# Patient Record
Sex: Female | Born: 1940
Health system: Southern US, Community
[De-identification: ages and names within clinical notes are randomized; demographics above are authoritative.]

## PROBLEM LIST (undated history)

## (undated) DIAGNOSIS — N393 Stress incontinence (female) (male): Secondary | ICD-10-CM

## (undated) DIAGNOSIS — Z77090 Contact with and (suspected) exposure to asbestos: Secondary | ICD-10-CM

## (undated) DIAGNOSIS — E039 Hypothyroidism, unspecified: Secondary | ICD-10-CM

## (undated) DIAGNOSIS — C439 Malignant melanoma of skin, unspecified: Secondary | ICD-10-CM

## (undated) DIAGNOSIS — M1990 Unspecified osteoarthritis, unspecified site: Secondary | ICD-10-CM

## (undated) DIAGNOSIS — M8580 Other specified disorders of bone density and structure, unspecified site: Secondary | ICD-10-CM

## (undated) DIAGNOSIS — M47816 Spondylosis without myelopathy or radiculopathy, lumbar region: Secondary | ICD-10-CM

## (undated) DIAGNOSIS — E785 Hyperlipidemia, unspecified: Secondary | ICD-10-CM

## (undated) DIAGNOSIS — C801 Malignant (primary) neoplasm, unspecified: Secondary | ICD-10-CM

## (undated) DIAGNOSIS — K649 Unspecified hemorrhoids: Secondary | ICD-10-CM

## (undated) DIAGNOSIS — I10 Essential (primary) hypertension: Secondary | ICD-10-CM

## (undated) DIAGNOSIS — E079 Disorder of thyroid, unspecified: Secondary | ICD-10-CM

## (undated) DIAGNOSIS — K209 Esophagitis, unspecified without bleeding: Secondary | ICD-10-CM

## (undated) DIAGNOSIS — Z780 Asymptomatic menopausal state: Secondary | ICD-10-CM

## (undated) DIAGNOSIS — B029 Zoster without complications: Secondary | ICD-10-CM

## (undated) DIAGNOSIS — R638 Other symptoms and signs concerning food and fluid intake: Secondary | ICD-10-CM

## (undated) DIAGNOSIS — M810 Age-related osteoporosis without current pathological fracture: Secondary | ICD-10-CM

## (undated) DIAGNOSIS — I83893 Varicose veins of bilateral lower extremities with other complications: Secondary | ICD-10-CM

## (undated) DIAGNOSIS — K219 Gastro-esophageal reflux disease without esophagitis: Secondary | ICD-10-CM

## (undated) HISTORY — DX: Other specified disorders of bone density and structure, unspecified site: M85.80

## (undated) HISTORY — DX: Stress incontinence (female) (male): N39.3

## (undated) HISTORY — DX: Other symptoms and signs concerning food and fluid intake: R63.8

## (undated) HISTORY — PX: CATARACT EXTRACTION: SUR2

## (undated) HISTORY — DX: Asymptomatic menopausal state: Z78.0

## (undated) HISTORY — DX: Unspecified hemorrhoids: K64.9

## (undated) HISTORY — DX: Gastro-esophageal reflux disease without esophagitis: K21.9

## (undated) HISTORY — DX: Hyperlipidemia, unspecified: E78.5

## (undated) HISTORY — DX: Age-related osteoporosis without current pathological fracture: M81.0

## (undated) HISTORY — PX: TUBAL LIGATION: SHX77

## (undated) HISTORY — PX: EYE SURGERY: SHX253

## (undated) HISTORY — DX: Zoster without complications: B02.9

## (undated) HISTORY — PX: COLONOSCOPY: SHX174

## (undated) HISTORY — DX: Essential (primary) hypertension: I10

## (undated) HISTORY — PX: ESOPHAGOGASTRODUODENOSCOPY: SHX1529

---

## 2004-11-15 ENCOUNTER — Ambulatory Visit: Payer: Self-pay | Admitting: Obstetrics and Gynecology

## 2005-01-18 ENCOUNTER — Ambulatory Visit: Payer: Self-pay | Admitting: Gastroenterology

## 2005-11-16 ENCOUNTER — Ambulatory Visit: Payer: Self-pay | Admitting: Obstetrics and Gynecology

## 2006-07-31 ENCOUNTER — Ambulatory Visit: Payer: Self-pay | Admitting: Gastroenterology

## 2006-11-19 ENCOUNTER — Ambulatory Visit: Payer: Self-pay | Admitting: Obstetrics and Gynecology

## 2007-06-23 ENCOUNTER — Ambulatory Visit: Payer: Self-pay | Admitting: Unknown Physician Specialty

## 2007-12-10 ENCOUNTER — Ambulatory Visit: Payer: Self-pay | Admitting: Obstetrics and Gynecology

## 2009-01-04 ENCOUNTER — Ambulatory Visit: Payer: Self-pay | Admitting: Internal Medicine

## 2010-01-05 ENCOUNTER — Ambulatory Visit: Payer: Self-pay | Admitting: Internal Medicine

## 2010-04-09 LAB — HM PAP SMEAR

## 2011-02-16 ENCOUNTER — Ambulatory Visit: Payer: Self-pay | Admitting: Internal Medicine

## 2012-02-18 ENCOUNTER — Ambulatory Visit: Payer: Self-pay | Admitting: Obstetrics and Gynecology

## 2012-06-07 LAB — HM COLONOSCOPY: HM COLON: NEGATIVE

## 2012-07-07 ENCOUNTER — Ambulatory Visit: Payer: Self-pay | Admitting: Unknown Physician Specialty

## 2012-08-07 LAB — HM DEXA SCAN

## 2012-09-02 ENCOUNTER — Ambulatory Visit: Payer: Self-pay | Admitting: Obstetrics and Gynecology

## 2013-02-18 ENCOUNTER — Ambulatory Visit: Payer: Self-pay | Admitting: Internal Medicine

## 2014-02-19 ENCOUNTER — Ambulatory Visit: Payer: Self-pay | Admitting: Internal Medicine

## 2014-04-09 LAB — HM MAMMOGRAPHY

## 2014-07-06 DIAGNOSIS — E039 Hypothyroidism, unspecified: Secondary | ICD-10-CM | POA: Insufficient documentation

## 2014-07-06 DIAGNOSIS — E782 Mixed hyperlipidemia: Secondary | ICD-10-CM | POA: Insufficient documentation

## 2014-07-06 DIAGNOSIS — K2100 Gastro-esophageal reflux disease with esophagitis, without bleeding: Secondary | ICD-10-CM | POA: Insufficient documentation

## 2015-03-07 ENCOUNTER — Other Ambulatory Visit: Payer: Self-pay | Admitting: Internal Medicine

## 2015-03-07 DIAGNOSIS — Z1231 Encounter for screening mammogram for malignant neoplasm of breast: Secondary | ICD-10-CM

## 2015-03-11 ENCOUNTER — Ambulatory Visit
Admission: RE | Admit: 2015-03-11 | Discharge: 2015-03-11 | Disposition: A | Discharge status: Home / Self Care | Payer: Medicare Other | Source: Ambulatory Visit | Attending: Internal Medicine | Admitting: Internal Medicine

## 2015-03-11 ENCOUNTER — Other Ambulatory Visit: Payer: Self-pay | Admitting: Internal Medicine

## 2015-03-11 DIAGNOSIS — Z1231 Encounter for screening mammogram for malignant neoplasm of breast: Secondary | ICD-10-CM | POA: Diagnosis not present

## 2015-03-11 HISTORY — DX: Malignant (primary) neoplasm, unspecified: C80.1

## 2015-07-12 ENCOUNTER — Ambulatory Visit (INDEPENDENT_AMBULATORY_CARE_PROVIDER_SITE_OTHER): Payer: Medicare Other | Admitting: Obstetrics and Gynecology

## 2015-07-12 ENCOUNTER — Encounter: Payer: Self-pay | Admitting: Obstetrics and Gynecology

## 2015-07-12 VITALS — BP 166/78 | HR 98 | Ht 59.0 in | Wt 145.7 lb

## 2015-07-12 DIAGNOSIS — M810 Age-related osteoporosis without current pathological fracture: Secondary | ICD-10-CM | POA: Insufficient documentation

## 2015-07-12 DIAGNOSIS — N393 Stress incontinence (female) (male): Secondary | ICD-10-CM | POA: Diagnosis not present

## 2015-07-12 DIAGNOSIS — N952 Postmenopausal atrophic vaginitis: Secondary | ICD-10-CM | POA: Diagnosis not present

## 2015-07-12 DIAGNOSIS — Z1211 Encounter for screening for malignant neoplasm of colon: Secondary | ICD-10-CM | POA: Diagnosis not present

## 2015-07-12 DIAGNOSIS — Z1239 Encounter for other screening for malignant neoplasm of breast: Secondary | ICD-10-CM | POA: Diagnosis not present

## 2015-07-12 DIAGNOSIS — Z780 Asymptomatic menopausal state: Secondary | ICD-10-CM | POA: Insufficient documentation

## 2015-07-12 NOTE — Patient Instructions (Signed)
1. Mammogram scheduled 2. Stool guaiac card testing for colon cancer screening ordered 3. Continue with calcium and vitamin D supplementation daily 4. Encourage exercise to maintain bone strength 5. Discuss possible interval DEXA scan with Dr. Sabra Heck 6. Consider adding EVista to osteoporosis management 7. Trial of Premarin vaginal cream twice a week is recommended; samples given 8. Return in 1 year

## 2015-07-12 NOTE — Progress Notes (Signed)
Patient ID: Kathryn Kemp, female   DOB: 1940-06-22, 75 y.o.   MRN: [id number] Annual GYN follow-up exam  Subjective:       Kathryn Kemp is a 75 y.o. G31P1001 female here for a routine annual gynecologic exam.  Current complaints: 1.  Menopause 2. Osteoporosis 3. Stress urinary incontinence  MENOPAUSE: Patient is asymptomatic except for some vaginal burning and irritation; not sexually active OSTEOPOROSIS: Patient is taking calcium with vitamin D; no other medications for osteoporosis; patient is not routinely exercising although she does take care of her sister who has Parkinson's STRESS URINARY INCONTINENCE: Symptoms are mild and as discussed last year she is not interested in physical therapy.   Gynecologic History No LMP recorded. Patient is postmenopausal. Contraception: post menopausal status Last Pap: 2012 wnl. Results were: normal Last mammogram: 01/2015 wnl. Results were: normal  Obstetric History OB History  Gravida Para Term Preterm AB SAB TAB Ectopic Multiple Living  1 1 1       1     # Outcome Date GA Lbr Len/2nd Weight Sex Delivery Anes PTL Lv  1 Term 1973   7 lb 4.8 oz (3.311 kg) M Vag-Spont   Y      Past Medical History  Diagnosis Date  . Cancer (HCC)     skin  . Shingles     h/o breast  . GERD (gastroesophageal reflux disease)   . Hyperlipemia   . Osteoporosis   . Increased BMI   . Hypertension   . SUI (stress urinary incontinence, female)   . Menopause   . Hemorrhoid     external  . Osteopenia     Past Surgical History  Procedure Laterality Date  . Tubal ligation      Current Outpatient Prescriptions on File Prior to Visit  Medication Sig Dispense Refill  . atorvastatin (LIPITOR) 10 MG tablet Take by mouth.    . Calcium-Vitamin D 600-200 MG-UNIT tablet Take by mouth.    . Multiple Vitamins-Minerals (PRESERVISION AREDS 2 PO) Take by mouth.    Marland Kitchen omeprazole (PRILOSEC) 20 MG capsule Take by mouth.     No current facility-administered  medications on file prior to visit.    No Known Allergies  Social History   Social History  . Marital Status: Married    Spouse Name: N/A  . Number of Children: N/A  . Years of Education: N/A   Occupational History  . Not on file.   Social History Main Topics  . Smoking status: Never Smoker   . Smokeless tobacco: Not on file  . Alcohol Use: No  . Drug Use: No  . Sexual Activity: Not Currently    Birth Control/ Protection: Surgical   Other Topics Concern  . Not on file   Social History Narrative    Family History  Problem Relation Age of Onset  . Breast cancer Neg Hx   . Diabetes Neg Hx   . Colon cancer Neg Hx   . Ovarian cancer Neg Hx   . Lung cancer Mother   . Heart disease Mother   . Osteoporosis Mother   . Prostate cancer Father   . Liver cancer Sister     The following portions of the patient's history were reviewed and updated as appropriate: allergies, current medications, past family history, past medical history, past social history, past surgical history and problem list.  Review of Systems ROS Review of Systems - General ROS: negative for - chills, fatigue, fever, hot flashes, night sweats,  weight gain or weight loss Psychological ROS: negative for - anxiety, decreased libido, depression, mood swings, physical abuse or sexual abuse Ophthalmic ROS: negative for - blurry vision, eye pain or loss of vision ENT ROS: negative for - headaches, hearing change, visual changes or vocal changes Allergy and Immunology ROS: negative for - hives, itchy/watery eyes or seasonal allergies Hematological and Lymphatic ROS: negative for - bleeding problems, bruising, swollen lymph nodes or weight loss Endocrine ROS: negative for - galactorrhea, hair pattern changes, hot flashes, malaise/lethargy, mood swings, palpitations, polydipsia/polyuria, skin changes, temperature intolerance or unexpected weight changes Breast ROS: negative for - new or changing breast lumps or  nipple discharge Respiratory ROS: negative for - cough or shortness of breath Cardiovascular ROS: negative for - chest pain, irregular heartbeat, palpitations or shortness of breath Gastrointestinal ROS: no abdominal pain, change in bowel habits, or black or bloody stools Genito-Urinary ROS: no dysuria, trouble voiding, or hematuria Musculoskeletal ROS: negative for - joint pain or joint stiffness Neurological ROS: negative for - bowel and bladder control changes Dermatological ROS: negative for rash and skin lesion changes   Objective:   BP 166/78 mmHg  Pulse 98  Ht 4\' 11"  (1.499 m)  Wt 145 lb 11.2 oz (66.089 kg)  BMI 29.41 kg/m2 CONSTITUTIONAL: Well-developed, well-nourished female in no acute distress.  PSYCHIATRIC: Normal mood and affect. Normal behavior. Normal judgment and thought content. NEUROLGIC: Alert and oriented to person, place, and time. Normal muscle tone coordination. No cranial nerve deficit noted. HENT:  Normocephalic, atraumatic, External right and left ear normal. Oropharynx is clear and moist EYES: Conjunctivae and EOM are normal. Pupils are equal, round, and reactive to light. No scleral icterus.  NECK: Normal range of motion, supple, no masses.  Normal thyroid.  SKIN: Skin is warm and dry. No rash noted. Not diaphoretic. No erythema. No pallor. CARDIOVASCULAR: Normal heart rate noted, regular rhythm, no murmur. RESPIRATORY: Clear to auscultation bilaterally. Effort and breath sounds normal, no problems with respiration noted. BREASTS: Symmetric in size. No masses, skin changes, nipple drainage, or lymphadenopathy. ABDOMEN: Soft, normal bowel sounds, no distention noted.  No tenderness, rebound or guarding.  BLADDER: Normal PELVIC:  External Genitalia: Normal  BUS: Urethral caruncle present  Vagina: Moderate atrophy  Cervix: Normal  Uterus: Normal; midplane, normal size and shape, nontender  Adnexa: Normal; nonpalpable and nontender  RV: External hemorrhoids  present, No Rectal Masses and Normal Sphincter tone  MUSCULOSKELETAL: Normal range of motion. No tenderness.  No cyanosis, clubbing, or edema.  2+ distal pulses. LYMPHATIC: No Axillary, Supraclavicular, or Inguinal Adenopathy.    Assessment:  1. Menopause, minimally symptomatic 2. History of osteoporosis; last DEXA scan 2014 3. Stress urinary incontinence, stable 4. Annual gynecologic examination 75 y.o. 5. Contraception: post menopausal status 6. BMI-29    Plan:  Pap: Not needed Mammogram: Ordered Stool Guaiac Testing:  Ordered Labs: thru pcp Routine preventative health maintenance measures emphasized: Exercise/Diet/Weight control, Tobacco Warnings and Alcohol/Substance use risks Start Premarin cream intravaginal twice a week; this should help vaginal dryness/irritation, and possibly have an impact on unstable bladder symptoms and incontinence Discuss osteoporosis with Dr. Hyacinth Meeker; may consider interval DEXA scan to see if worsening trend is developing; consider a trial of Evista Return to Clinic - 1 Year   Crystal Talco, CMA  Herold Harms, MD  Note: This dictation was prepared with Dragon dictation along with smaller phrase technology. Any transcriptional errors that result from this process are unintentional.

## 2015-08-05 LAB — FECAL OCCULT BLOOD, IMMUNOCHEMICAL: FECAL OCCULT BLD: NEGATIVE

## 2016-02-06 ENCOUNTER — Other Ambulatory Visit: Payer: Self-pay | Admitting: Internal Medicine

## 2016-02-06 DIAGNOSIS — Z1231 Encounter for screening mammogram for malignant neoplasm of breast: Secondary | ICD-10-CM

## 2016-03-15 ENCOUNTER — Ambulatory Visit
Admission: RE | Admit: 2016-03-15 | Discharge: 2016-03-15 | Disposition: A | Discharge status: Home / Self Care | Payer: Medicare Other | Source: Ambulatory Visit | Attending: Internal Medicine | Admitting: Internal Medicine

## 2016-03-15 DIAGNOSIS — Z1231 Encounter for screening mammogram for malignant neoplasm of breast: Secondary | ICD-10-CM | POA: Diagnosis not present

## 2016-07-20 ENCOUNTER — Other Ambulatory Visit: Payer: Self-pay | Admitting: Internal Medicine

## 2016-07-20 DIAGNOSIS — I6523 Occlusion and stenosis of bilateral carotid arteries: Secondary | ICD-10-CM

## 2016-07-24 ENCOUNTER — Ambulatory Visit
Admission: RE | Admit: 2016-07-24 | Discharge: 2016-07-24 | Disposition: A | Discharge status: Home / Self Care | Payer: Medicare Other | Source: Ambulatory Visit | Attending: Internal Medicine | Admitting: Internal Medicine

## 2016-07-24 DIAGNOSIS — I6523 Occlusion and stenosis of bilateral carotid arteries: Secondary | ICD-10-CM

## 2017-02-04 ENCOUNTER — Other Ambulatory Visit: Payer: Self-pay | Admitting: Internal Medicine

## 2017-02-20 ENCOUNTER — Other Ambulatory Visit: Payer: Self-pay | Admitting: Internal Medicine

## 2017-02-20 DIAGNOSIS — Z1231 Encounter for screening mammogram for malignant neoplasm of breast: Secondary | ICD-10-CM

## 2017-02-28 ENCOUNTER — Emergency Department
Admission: EM | Admit: 2017-02-28 | Discharge: 2017-02-28 | Disposition: A | Discharge status: Home / Self Care | Payer: Medicare Other | Attending: Emergency Medicine | Admitting: Emergency Medicine

## 2017-02-28 ENCOUNTER — Encounter: Payer: Self-pay | Admitting: Emergency Medicine

## 2017-02-28 DIAGNOSIS — I10 Essential (primary) hypertension: Secondary | ICD-10-CM | POA: Diagnosis not present

## 2017-02-28 LAB — BASIC METABOLIC PANEL
ANION GAP: 8 (ref 5–15)
BUN: 14 mg/dL (ref 6–20)
CALCIUM: 9.5 mg/dL (ref 8.9–10.3)
CO2: 24 mmol/L (ref 22–32)
Chloride: 105 mmol/L (ref 101–111)
Creatinine, Ser: 0.73 mg/dL (ref 0.44–1.00)
GFR calc non Af Amer: >60 mL/min (ref >60)
Glucose, Bld: 106 mg/dL — ABNORMAL HIGH (ref 65–99)
Potassium: 4.2 mmol/L (ref 3.5–5.1)
Sodium: 137 mmol/L (ref 135–145)

## 2017-02-28 LAB — CBC WITH DIFFERENTIAL/PLATELET
BASOS ABS: 0 10*3/uL (ref 0–0.1)
BASOS PCT: 1 %
Eosinophils Absolute: 0.1 10*3/uL (ref 0–0.7)
Eosinophils Relative: 2 %
HEMATOCRIT: 38.7 % (ref 35.0–47.0)
Hemoglobin: 12.8 g/dL (ref 12.0–16.0)
Lymphocytes Relative: 28 %
Lymphs Abs: 1.7 10*3/uL (ref 1.0–3.6)
MCH: 26 pg (ref 26.0–34.0)
MCHC: 33.1 g/dL (ref 32.0–36.0)
MCV: 78.6 fL — ABNORMAL LOW (ref 80.0–100.0)
MONO ABS: 0.5 10*3/uL (ref 0.2–0.9)
Monocytes Relative: 9 %
NEUTROS ABS: 3.5 10*3/uL (ref 1.4–6.5)
NEUTROS PCT: 60 %
Platelets: 184 10*3/uL (ref 150–440)
RBC: 4.93 MIL/uL (ref 3.80–5.20)
RDW: 13 % (ref 11.5–14.5)
WBC: 5.9 10*3/uL (ref 3.6–11.0)

## 2017-02-28 LAB — TROPONIN I: Troponin I: <0.03 ng/mL (ref <0.03)

## 2017-02-28 NOTE — ED Notes (Signed)
NAD noted at time of D/C. Pt denies questions or concerns. Pt ambulatory to the lobby at this time.  

## 2017-02-28 NOTE — Discharge Instructions (Signed)
Please seek medical attention for any high fevers, chest pain, shortness of breath, change in behavior, persistent vomiting, bloody stool or any other new or concerning symptoms.  

## 2017-02-28 NOTE — ED Provider Notes (Signed)
[facility] Emergency Department Provider Note  ____________________________________________   I have reviewed the triage vital signs and the nursing notes.   HISTORY  Chief Complaint Hypertension   History limited by: Not Limited   HPI Kathryn Kemp is a 76 y.o. female who presents to the emergency department today because of high blood pressure.  DURATION:started this morning TIMING: constant SEVERITY: severe CONTEXT: patient states that she checks her blood pressure every morning. She apparently is not on any medications but checks it every morning so she can report it to her primary care doctor. Normal bp is 130/140s. Today it was significantly higher. She states that she has been under stress recently due to death of her last sister a couple of months ago. She has had one other episode in the past with high blood pressure which she thinks was due to stress. ASSOCIATED SYMPTOMS: slight dizziness and head ache.   Per medical record review patient has a history of hypertension.  Past Medical History:  Diagnosis Date  . Cancer ([place])    skin  . GERD (gastroesophageal reflux disease)   . Hemorrhoid    external  . Hyperlipemia   . Hypertension   . Increased BMI   . Menopause   . Osteopenia   . Osteoporosis   . Shingles    h/o breast  . SUI (stress urinary incontinence, female)     Patient Active Problem List   Diagnosis Date Noted  . Vaginal atrophy 07/12/2015  . Menopause 07/12/2015  . SUI (stress urinary incontinence, female) 07/12/2015  . Osteoporosis 07/12/2015    Past Surgical History:  Procedure Laterality Date  . TUBAL LIGATION      Prior to Admission medications   Medication Sig Start Date End Date Taking? Authorizing Provider  atorvastatin (LIPITOR) 10 MG tablet Take by mouth. 07/13/14 07/13/15  [provider]  Calcium-Vitamin D 600-200 MG-UNIT tablet Take by mouth.    [provider]  Multiple  Vitamins-Minerals (PRESERVISION AREDS 2 PO) Take by mouth.    [provider]  omeprazole (PRILOSEC) 20 MG capsule Take by mouth. 07/13/14   [provider]    Allergies Patient has no known allergies.  Family History  Problem Relation Age of Onset  . Lung cancer Mother   . Heart disease Mother   . Osteoporosis Mother   . Prostate cancer Father   . Liver cancer Sister   . Breast cancer Neg Hx   . Diabetes Neg Hx   . Colon cancer Neg Hx   . Ovarian cancer Neg Hx     Social History Social History   Tobacco Use  . Smoking status: Never Smoker  . Smokeless tobacco: Never Used  Substance Use Topics  . Alcohol use: No  . Drug use: No    Review of Systems Constitutional: No fever/chills Eyes: No visual changes. ENT: No sore throat. Cardiovascular: Denies chest pain. Respiratory: Denies shortness of breath. Gastrointestinal: No abdominal pain.  No nausea, no vomiting.  No diarrhea.   Genitourinary: Negative for dysuria. Musculoskeletal: Negative for back pain. Skin: Negative for rash. Neurological: Positive for headache. ____________________________________________   PHYSICAL EXAM:  VITAL SIGNS: ED Triage Vitals  Enc Vitals Group     BP 02/28/17 0859 (!) 208/97     Pulse Rate 02/28/17 0859 96     Resp 02/28/17 0859 16     Temp 02/28/17 0859 (!) 97.5 F (36.4 C)     Temp Source 02/28/17 0859 Oral  SpO2 02/28/17 0859 100 %     Weight 02/28/17 0900 145 lb (65.8 kg)     Height --      Head Circumference --      Peak Flow --      Pain Score 02/28/17 0859 0   Constitutional: Alert and oriented. Well appearing and in no distress. Eyes: Conjunctivae are normal.  ENT   Head: Normocephalic and atraumatic.   Nose: No congestion/rhinnorhea.   Mouth/Throat: Mucous membranes are moist.   Neck: No stridor. Hematological/Lymphatic/Immunilogical: No cervical lymphadenopathy. Cardiovascular: Normal rate, regular rhythm.  No murmurs, rubs, or  gallops.  Respiratory: Normal respiratory effort without tachypnea nor retractions. Breath sounds are clear and equal bilaterally. No wheezes/rales/rhonchi. Gastrointestinal: Soft and non tender. No rebound. No guarding.  Genitourinary: Deferred Musculoskeletal: Normal range of motion in all extremities. No lower extremity edema. Neurologic:  Normal speech and language. No gross focal neurologic deficits are appreciated.  Skin:  Skin is warm, dry and intact. No rash noted. Psychiatric: Mood and affect are normal. Speech and behavior are normal. Patient exhibits appropriate insight and judgment.  ____________________________________________    LABS (pertinent positives/negatives)  Trop <0.03 CBC wnl except mcv BMP wnl except glu 106  ____________________________________________   EKG  None  ____________________________________________    RADIOLOGY  None  ____________________________________________   PROCEDURES  Procedures  ____________________________________________   INITIAL IMPRESSION / ASSESSMENT AND PLAN / ED COURSE  Pertinent labs & imaging results that were available during my care of the patient were reviewed by me and considered in my medical decision making (see chart for details).  Patient presents because of concern for high blood pressure. Blood work does not show any evidence of cardiac or kidney injury. No elevated WBC count to suggest infection. Discussed importance of follow up with primary care doctor with patient.  ____________________________________________   FINAL CLINICAL IMPRESSION(S) / ED DIAGNOSES  Final diagnoses:  Hypertension, unspecified type     Note: This dictation was prepared with Dragon dictation. Any transcriptional errors that result from this process are unintentional     Nance Pear, MD 02/28/17 1123

## 2017-02-28 NOTE — ED Triage Notes (Signed)
Pt to ed with c/o htn at home today.  Reports she is not on meds for bp at home but felt dizzy today so she checked BP and found it to be high.  Pt denies chest pain, reports mild headache.

## 2017-02-28 NOTE — ED Notes (Signed)
MD aware of patient's BP at D/C, states okay for D/C.

## 2017-03-05 DIAGNOSIS — I10 Essential (primary) hypertension: Secondary | ICD-10-CM | POA: Insufficient documentation

## 2017-03-19 ENCOUNTER — Ambulatory Visit
Admission: RE | Admit: 2017-03-19 | Discharge: 2017-03-19 | Disposition: A | Discharge status: Home / Self Care | Payer: Medicare Other | Source: Ambulatory Visit | Attending: Internal Medicine | Admitting: Internal Medicine

## 2017-03-19 DIAGNOSIS — Z1231 Encounter for screening mammogram for malignant neoplasm of breast: Secondary | ICD-10-CM | POA: Diagnosis not present

## 2017-07-24 DIAGNOSIS — Z0000 Encounter for general adult medical examination without abnormal findings: Secondary | ICD-10-CM | POA: Insufficient documentation

## 2017-09-17 ENCOUNTER — Encounter: Payer: Self-pay | Admitting: Student

## 2017-09-18 ENCOUNTER — Encounter: Payer: Self-pay | Admitting: *Deleted

## 2017-09-18 ENCOUNTER — Ambulatory Visit: Payer: Medicare Other | Admitting: Anesthesiology

## 2017-09-18 ENCOUNTER — Encounter
Admission: RE | Disposition: A | Discharge status: Home / Self Care | Payer: Self-pay | Source: Ambulatory Visit | Attending: Unknown Physician Specialty

## 2017-09-18 ENCOUNTER — Ambulatory Visit
Admission: RE | Admit: 2017-09-18 | Discharge: 2017-09-18 | Disposition: A | Discharge status: Home / Self Care | Payer: Medicare Other | Source: Ambulatory Visit | Attending: Unknown Physician Specialty | Admitting: Unknown Physician Specialty

## 2017-09-18 DIAGNOSIS — K5790 Diverticulosis of intestine, part unspecified, without perforation or abscess without bleeding: Secondary | ICD-10-CM | POA: Insufficient documentation

## 2017-09-18 DIAGNOSIS — Z86010 Personal history of colonic polyps: Secondary | ICD-10-CM | POA: Diagnosis not present

## 2017-09-18 DIAGNOSIS — K640 First degree hemorrhoids: Secondary | ICD-10-CM | POA: Insufficient documentation

## 2017-09-18 DIAGNOSIS — Z1211 Encounter for screening for malignant neoplasm of colon: Secondary | ICD-10-CM | POA: Diagnosis present

## 2017-09-18 HISTORY — DX: Esophagitis, unspecified without bleeding: K20.90

## 2017-09-18 HISTORY — PX: COLONOSCOPY WITH PROPOFOL: SHX5780

## 2017-09-18 HISTORY — DX: Esophagitis, unspecified: K20.9

## 2017-09-18 HISTORY — DX: Zoster without complications: B02.9

## 2017-09-18 HISTORY — DX: Disorder of thyroid, unspecified: E07.9

## 2017-09-18 HISTORY — DX: Unspecified osteoarthritis, unspecified site: M19.90

## 2017-09-18 SURGERY — COLONOSCOPY WITH PROPOFOL
Anesthesia: General

## 2017-09-18 MED ORDER — LIDOCAINE HCL (PF) 2 % IJ SOLN
INTRAMUSCULAR | Status: AC
  Filled 2017-09-18: qty 10

## 2017-09-18 MED ORDER — FENTANYL CITRATE (PF) 100 MCG/2ML IJ SOLN
INTRAMUSCULAR | Status: DC | PRN
  Administered 2017-09-18 (×2): 50 ug via INTRAVENOUS

## 2017-09-18 MED ORDER — PROPOFOL 500 MG/50ML IV EMUL
INTRAVENOUS | Status: AC
  Filled 2017-09-18: qty 50

## 2017-09-18 MED ORDER — LIDOCAINE 2% (20 MG/ML) 5 ML SYRINGE
INTRAMUSCULAR | Status: DC | PRN
  Administered 2017-09-18: 30 mg via INTRAVENOUS

## 2017-09-18 MED ORDER — PROPOFOL 500 MG/50ML IV EMUL
INTRAVENOUS | Status: DC | PRN
  Administered 2017-09-18: 150 ug/kg/min via INTRAVENOUS

## 2017-09-18 MED ORDER — FENTANYL CITRATE (PF) 100 MCG/2ML IJ SOLN
INTRAMUSCULAR | Status: AC
  Filled 2017-09-18: qty 2

## 2017-09-18 MED ORDER — SODIUM CHLORIDE 0.9 % IV SOLN: INTRAVENOUS | Status: DC

## 2017-09-18 MED ORDER — SODIUM CHLORIDE 0.9 % IV SOLN
INTRAVENOUS | Status: DC
  Administered 2017-09-18: 10:00:00 via INTRAVENOUS
  Administered 2017-09-18: 1000 mL via INTRAVENOUS

## 2017-09-18 MED ORDER — PROPOFOL 10 MG/ML IV BOLUS
INTRAVENOUS | Status: DC | PRN
  Administered 2017-09-18: 100 mg via INTRAVENOUS

## 2017-09-18 NOTE — Anesthesia Post-op Follow-up Note (Signed)
Anesthesia QCDR form completed.        

## 2017-09-18 NOTE — Transfer of Care (Signed)
Immediate Anesthesia Transfer of Care Note  Patient: Kathryn Kemp  Procedure(s) Performed: COLONOSCOPY WITH PROPOFOL (N/A )  Patient Location: PACU and Endoscopy Unit  Anesthesia Type:General  Level of Consciousness: awake, drowsy and patient cooperative  Airway & Oxygen Therapy: Patient Spontanous Breathing and Patient connected to nasal cannula oxygen  Post-op Assessment: Report given to RN and Post -op Vital signs reviewed and stable  Post vital signs: Reviewed and stable  Last Vitals:  Vitals Value Taken Time  BP 115/68 09/18/2017 10:10 AM  Temp 36.1 C 09/18/2017 10:10 AM  Pulse 68 09/18/2017 10:10 AM  Resp 11 09/18/2017 10:10 AM  SpO2 99 % 09/18/2017 10:10 AM  Vitals shown include unvalidated device data.  Last Pain:  Vitals:   09/18/17 1000  TempSrc: Tympanic  PainSc:          Complications: No apparent anesthesia complications

## 2017-09-18 NOTE — Op Note (Signed)
[facility] Gastroenterology Patient Name: Kathryn Kemp Procedure Date: 09/18/2017 9:38 AM MRN: [id number] Account #: [id number] Date of Birth: [birth year] Admit Type: Outpatient Age: 77 Room: [facility] ENDO ROOM 3 Gender: Female Note Status: Finalized Procedure:            Colonoscopy Indications:          High risk colon cancer surveillance: Personal history                        of colonic polyps Providers:            Scot Jun, MD Referring MD:         Danella Penton, MD (Referring MD) Medicines:            Propofol per Anesthesia Complications:        No immediate complications. Procedure:            Pre-Anesthesia Assessment:                       - After reviewing the risks and benefits, the patient                        was deemed in satisfactory condition to undergo the                        procedure.                       After obtaining informed consent, the colonoscope was                        passed under direct vision. Throughout the procedure,                        the patient's blood pressure, pulse, and oxygen                        saturations were monitored continuously. The                        Colonoscope was introduced through the anus and                        advanced to the the cecum, identified by appendiceal                        orifice and ileocecal valve. The colonoscopy was                        performed without difficulty. The patient tolerated the                        procedure well. The quality of the bowel preparation                        was good. Findings:      Internal hemorrhoids were found during endoscopy. The hemorrhoids were       small and Grade I (internal hemorrhoids that do not prolapse).      A few small-mouthed diverticula were found in the ascending colon.      The exam was  otherwise without abnormality. Impression:           - Internal hemorrhoids.                       - Diverticulosis in the  proximal ascending colon. (few                        small)                       - The examination was otherwise normal.                       - No specimens collected. Recommendation:       - Repeat colonoscopy in 5 years for adenoma                        surveillance. Scot Jun, MD 09/18/2017 10:05:30 AM This report has been signed electronically. Number of Addenda: 0 Note Initiated On: 09/18/2017 9:38 AM Scope Withdrawal Time: 0 hours 10 minutes 11 seconds  Total Procedure Duration: 0 hours 13 minutes 44 seconds       [facility]

## 2017-09-18 NOTE — H&P (Signed)
Primary Care Physician:  Rusty Aus, MD Primary Gastroenterologist:  Dr. Vira Agar  Pre-Procedure History & Physical: HPI:  Kathryn Kemp is a 77 y.o. female is here for an colonoscopy.  Done for Kathryn Kemp colon polyps.   Past Medical History:  Diagnosis Date  . Arthritis   . Cancer ([place])    skin  . Esophagitis   . GERD (gastroesophageal reflux disease)   . Hemorrhoid    external  . Herpes zoster   . Hyperlipemia   . Hypertension   . Increased BMI   . Menopause   . Osteopenia   . Osteopenia   . Osteoporosis   . Shingles    h/o breast  . SUI (stress urinary incontinence, female)   . Thyroid disease     Past Surgical History:  Procedure Laterality Date  . CATARACT EXTRACTION    . COLONOSCOPY    . TUBAL LIGATION      Prior to Admission medications   Medication Sig Start Date End Date Taking? Authorizing Provider  hydrochlorothiazide (HYDRODIURIL) 25 MG tablet Take 25 mg by mouth daily.   Yes [provider]  Calcium-Vitamin D 600-200 MG-UNIT tablet Take by mouth.    [provider]  Multiple Vitamins-Minerals (PRESERVISION AREDS 2 PO) Take by mouth.    [provider]  omeprazole (PRILOSEC) 20 MG capsule Take by mouth. 07/13/14   [provider]    Allergies as of 08/05/2017  . (No Known Allergies)    Family History  Problem Relation Age of Onset  . Lung cancer Mother   . Heart disease Mother   . Osteoporosis Mother   . Prostate cancer Father   . Liver cancer Sister   . Breast cancer Neg Hx   . Diabetes Neg Hx   . Colon cancer Neg Hx   . Ovarian cancer Neg Hx     Social History   Socioeconomic History  . Marital status: Married    Spouse name: Not on file  . Number of children: Not on file  . Years of education: Not on file  . Highest education level: Not on file  Occupational History  . Not on file  Social Needs  . Financial resource strain: Not on file  . Food insecurity:    Worry: Not on file    Inability: Not  on file  . Transportation needs:    Medical: Not on file    Non-medical: Not on file  Tobacco Use  . Smoking status: Never Smoker  . Smokeless tobacco: Never Used  Substance and Sexual Activity  . Alcohol use: No  . Drug use: No  . Sexual activity: Not Currently    Birth control/protection: Surgical  Lifestyle  . Physical activity:    Days per week: Not on file    Minutes per session: Not on file  . Stress: Not on file  Relationships  . Social connections:    Talks on phone: Not on file    Gets together: Not on file    Attends religious service: Not on file    Active member of club or organization: Not on file    Attends meetings of clubs or organizations: Not on file    Relationship status: Not on file  . Intimate partner violence:    Fear of current or ex partner: Not on file    Emotionally abused: Not on file    Physically abused: Not on file    Forced sexual activity: Not on  file  Other Topics Concern  . Not on file  Social History Narrative  . Not on file    Review of Systems: See HPI, otherwise negative ROS  Physical Exam: BP (!) 156/77   Pulse 87   Temp (!) 97 F (36.1 C) (Tympanic)   Resp 18   Ht 4\' 11"  (1.499 m)   Wt 62.1 kg (137 lb)   SpO2 96%   BMI 27.67 kg/m  General:   Alert,  pleasant and cooperative in NAD Head:  Normocephalic and atraumatic. Neck:  Supple; no masses or thyromegaly. Lungs:  Clear throughout to auscultation.    Heart:  Regular rate and rhythm. Abdomen:  Soft, nontender and nondistended. Normal bowel sounds, without guarding, and without rebound.   Neurologic:  Alert and  oriented x4;  grossly normal neurologically.  Impression/Plan: Kathryn Kemp is here for an colonoscopy to be performed for Kathryn Kemp colon polyps.  Risks, benefits, limitations, and alternatives regarding  colonoscopy have been reviewed with the patient.  Questions have been answered.  All parties agreeable.   [name], MD  09/18/2017, 9:34 AM

## 2017-09-18 NOTE — Anesthesia Preprocedure Evaluation (Signed)
Anesthesia Evaluation  Patient identified by MRN, date of birth, ID band Patient awake    Reviewed: Allergy & Precautions, H&P , NPO status , Patient's Chart, lab work & pertinent test results, reviewed documented beta blocker date and time   History of Anesthesia Complications Negative for: history of anesthetic complications  Airway Mallampati: II  TM Distance: >3 FB Neck ROM: full    Dental  (+) Dental Advidsory Given, Partial Upper, Caps, Teeth Intact   Pulmonary neg pulmonary ROS,           Cardiovascular Exercise Tolerance: Good hypertension, (-) angina(-) CAD, (-) Past MI, (-) Cardiac Stents and (-) CABG (-) dysrhythmias (-) Valvular Problems/Murmurs     Neuro/Psych negative neurological ROS  negative psych ROS   GI/Hepatic Neg liver ROS, GERD  ,  Endo/Other  negative endocrine ROS  Renal/GU negative Renal ROS  negative genitourinary   Musculoskeletal   Abdominal   Peds  Hematology negative hematology ROS (+)   Anesthesia Other Findings Past Medical History: No date: Arthritis No date: Cancer ([place])     Comment:  skin No date: Esophagitis No date: GERD (gastroesophageal reflux disease) No date: Hemorrhoid     Comment:  external No date: Herpes zoster No date: Hyperlipemia No date: Hypertension No date: Increased BMI No date: Menopause No date: Osteopenia No date: Osteopenia No date: Osteoporosis No date: Shingles     Comment:  h/o breast No date: SUI (stress urinary incontinence, female) No date: Thyroid disease   Reproductive/Obstetrics negative OB ROS                             Anesthesia Physical Anesthesia Plan  ASA: II  Anesthesia Plan: General   Post-op Pain Management:    Induction: Intravenous  PONV Risk Score and Plan: 3 and Propofol infusion  Airway Management Planned: Nasal Cannula  Additional Equipment:   Intra-op Plan:   Post-operative  Plan:   Informed Consent: I have reviewed the patients History and Physical, chart, labs and discussed the procedure including the risks, benefits and alternatives for the proposed anesthesia with the patient or authorized representative who has indicated his/her understanding and acceptance.   Dental Advisory Given  Plan Discussed with: Anesthesiologist, CRNA and Surgeon  Anesthesia Plan Comments:         Anesthesia Quick Evaluation

## 2017-09-19 NOTE — Anesthesia Postprocedure Evaluation (Addendum)
Anesthesia Post Note  Patient: Kathryn Kemp  Procedure(s) Performed: COLONOSCOPY WITH PROPOFOL (N/A )  Patient location during evaluation: Endoscopy Anesthesia Type: General Level of consciousness: awake and alert Pain management: pain level controlled Vital Signs Assessment: post-procedure vital signs reviewed and stable Respiratory status: spontaneous breathing, nonlabored ventilation, respiratory function stable and patient connected to nasal cannula oxygen Cardiovascular status: blood pressure returned to baseline and stable Postop Assessment: no apparent nausea or vomiting Anesthetic complications: no     Last Vitals:  Vitals:   09/18/17 1020 09/18/17 1030  BP: 135/73 137/73  Pulse: 66 69  Resp: 13 (!) 21  Temp:    SpO2: 99% 95%    Last Pain:  Vitals:   09/18/17 1000  TempSrc: Tympanic  PainSc:                  Martha Clan

## 2017-09-24 ENCOUNTER — Encounter: Payer: Self-pay | Admitting: Unknown Physician Specialty

## 2018-02-24 ENCOUNTER — Other Ambulatory Visit: Payer: Self-pay | Admitting: Internal Medicine

## 2018-02-24 DIAGNOSIS — Z1231 Encounter for screening mammogram for malignant neoplasm of breast: Secondary | ICD-10-CM

## 2018-02-25 DIAGNOSIS — H02831 Dermatochalasis of right upper eyelid: Secondary | ICD-10-CM | POA: Insufficient documentation

## 2018-02-25 DIAGNOSIS — H5340 Unspecified visual field defects: Secondary | ICD-10-CM | POA: Insufficient documentation

## 2018-02-25 DIAGNOSIS — H02403 Unspecified ptosis of bilateral eyelids: Secondary | ICD-10-CM | POA: Insufficient documentation

## 2018-03-17 ENCOUNTER — Ambulatory Visit: Payer: Medicare Other | Admitting: Occupational Therapy

## 2018-03-21 ENCOUNTER — Ambulatory Visit
Admission: RE | Admit: 2018-03-21 | Discharge: 2018-03-21 | Disposition: A | Discharge status: Home / Self Care | Payer: Medicare Other | Source: Ambulatory Visit | Attending: Internal Medicine | Admitting: Internal Medicine

## 2018-03-21 DIAGNOSIS — Z1231 Encounter for screening mammogram for malignant neoplasm of breast: Secondary | ICD-10-CM | POA: Diagnosis present

## 2018-03-26 ENCOUNTER — Other Ambulatory Visit: Payer: Self-pay | Admitting: Internal Medicine

## 2018-03-26 DIAGNOSIS — R928 Other abnormal and inconclusive findings on diagnostic imaging of breast: Secondary | ICD-10-CM

## 2018-03-26 DIAGNOSIS — N6489 Other specified disorders of breast: Secondary | ICD-10-CM

## 2018-04-07 ENCOUNTER — Ambulatory Visit: Payer: Medicare Other | Admitting: Occupational Therapy

## 2018-04-08 ENCOUNTER — Ambulatory Visit
Admission: RE | Admit: 2018-04-08 | Discharge: 2018-04-08 | Disposition: A | Discharge status: Home / Self Care | Payer: Medicare Other | Source: Ambulatory Visit | Attending: Internal Medicine | Admitting: Internal Medicine

## 2018-04-08 ENCOUNTER — Other Ambulatory Visit: Payer: Self-pay | Admitting: Internal Medicine

## 2018-04-08 DIAGNOSIS — N6489 Other specified disorders of breast: Secondary | ICD-10-CM

## 2018-04-08 DIAGNOSIS — R928 Other abnormal and inconclusive findings on diagnostic imaging of breast: Secondary | ICD-10-CM

## 2018-04-10 ENCOUNTER — Other Ambulatory Visit: Payer: Self-pay | Admitting: Internal Medicine

## 2018-04-10 DIAGNOSIS — N6320 Unspecified lump in the left breast, unspecified quadrant: Secondary | ICD-10-CM

## 2018-04-15 DIAGNOSIS — R1084 Generalized abdominal pain: Secondary | ICD-10-CM | POA: Diagnosis not present

## 2018-04-15 DIAGNOSIS — M65311 Trigger thumb, right thumb: Secondary | ICD-10-CM | POA: Diagnosis not present

## 2018-04-15 DIAGNOSIS — Z0000 Encounter for general adult medical examination without abnormal findings: Secondary | ICD-10-CM | POA: Diagnosis not present

## 2018-04-15 DIAGNOSIS — K922 Gastrointestinal hemorrhage, unspecified: Secondary | ICD-10-CM | POA: Diagnosis not present

## 2018-05-19 DIAGNOSIS — Z872 Personal history of diseases of the skin and subcutaneous tissue: Secondary | ICD-10-CM | POA: Diagnosis not present

## 2018-05-19 DIAGNOSIS — Z85820 Personal history of malignant melanoma of skin: Secondary | ICD-10-CM | POA: Diagnosis not present

## 2018-05-19 DIAGNOSIS — L821 Other seborrheic keratosis: Secondary | ICD-10-CM | POA: Diagnosis not present

## 2018-05-19 DIAGNOSIS — L578 Other skin changes due to chronic exposure to nonionizing radiation: Secondary | ICD-10-CM | POA: Diagnosis not present

## 2018-05-19 DIAGNOSIS — L570 Actinic keratosis: Secondary | ICD-10-CM | POA: Diagnosis not present

## 2018-05-19 DIAGNOSIS — Z1283 Encounter for screening for malignant neoplasm of skin: Secondary | ICD-10-CM | POA: Diagnosis not present

## 2018-05-19 DIAGNOSIS — Z85828 Personal history of other malignant neoplasm of skin: Secondary | ICD-10-CM | POA: Diagnosis not present

## 2018-07-16 ENCOUNTER — Encounter: Payer: Self-pay | Admitting: *Deleted

## 2018-07-29 DIAGNOSIS — E039 Hypothyroidism, unspecified: Secondary | ICD-10-CM | POA: Diagnosis not present

## 2018-07-29 DIAGNOSIS — Z1331 Encounter for screening for depression: Secondary | ICD-10-CM | POA: Diagnosis not present

## 2018-07-29 DIAGNOSIS — Z77090 Contact with and (suspected) exposure to asbestos: Secondary | ICD-10-CM | POA: Diagnosis not present

## 2018-07-29 DIAGNOSIS — E782 Mixed hyperlipidemia: Secondary | ICD-10-CM | POA: Diagnosis not present

## 2018-07-29 DIAGNOSIS — Z79899 Other long term (current) drug therapy: Secondary | ICD-10-CM | POA: Diagnosis not present

## 2018-07-29 DIAGNOSIS — Z0000 Encounter for general adult medical examination without abnormal findings: Secondary | ICD-10-CM | POA: Diagnosis not present

## 2018-09-03 ENCOUNTER — Other Ambulatory Visit: Payer: Self-pay | Admitting: *Deleted

## 2018-09-03 NOTE — Patient Outreach (Signed)
[facility]) Care Management  09/03/2018  Kathryn Kemp December 11, 1940 [id number]   Telephone assessment complete according to health risk assessment through [facility].  No needs identified, [facility] information letter, pamphlet, and magnet sent to member.  Will follow up within the next 6 months.  Valente David, [place], MSN [place] [phone]

## 2018-10-06 ENCOUNTER — Other Ambulatory Visit: Payer: Self-pay | Admitting: *Deleted

## 2018-10-06 NOTE — Patient Outreach (Signed)
[facility]) Care Management  10/06/2018  GEANA WALTS 1940-04-10 [id number]   Member will be followed by external care management program.  Will close case at this time, will notify primary MD of transition to external program.  Valente David, RN, MSN [place] Manager [phone]

## 2018-10-07 ENCOUNTER — Ambulatory Visit
Admission: RE | Admit: 2018-10-07 | Discharge: 2018-10-07 | Disposition: A | Discharge status: Home / Self Care | Payer: PPO | Source: Ambulatory Visit | Attending: Internal Medicine | Admitting: Internal Medicine

## 2018-10-07 ENCOUNTER — Other Ambulatory Visit: Payer: Self-pay

## 2018-10-07 DIAGNOSIS — N6321 Unspecified lump in the left breast, upper outer quadrant: Secondary | ICD-10-CM | POA: Diagnosis not present

## 2018-10-07 DIAGNOSIS — N6320 Unspecified lump in the left breast, unspecified quadrant: Secondary | ICD-10-CM | POA: Insufficient documentation

## 2019-01-13 ENCOUNTER — Ambulatory Visit: Payer: No Typology Code available for payment source | Admitting: *Deleted

## 2019-04-09 ENCOUNTER — Ambulatory Visit: Payer: PPO | Attending: Internal Medicine

## 2019-04-09 DIAGNOSIS — Z20822 Contact with and (suspected) exposure to covid-19: Secondary | ICD-10-CM

## 2019-04-15 ENCOUNTER — Telehealth: Payer: Self-pay

## 2019-04-15 LAB — NOVEL CORONAVIRUS, NAA

## 2019-04-15 NOTE — Telephone Encounter (Signed)
Pt called to get covid test results from 12/31 @ [place]. Pt will have to be retested. Call was transferred to scheduling nurse.   [place]

## 2019-04-16 ENCOUNTER — Ambulatory Visit: Payer: PPO | Attending: Internal Medicine

## 2019-04-16 DIAGNOSIS — Z20822 Contact with and (suspected) exposure to covid-19: Secondary | ICD-10-CM

## 2019-04-18 ENCOUNTER — Telehealth: Payer: Self-pay | Admitting: Hematology

## 2019-04-18 LAB — NOVEL CORONAVIRUS, NAA: SARS-CoV-2, NAA: NOT DETECTED

## 2019-04-18 NOTE — Telephone Encounter (Signed)
Pt is aware  covid 19 test is neg on 04-18-2019

## 2019-04-24 ENCOUNTER — Other Ambulatory Visit: Payer: Self-pay | Admitting: Internal Medicine

## 2019-04-24 DIAGNOSIS — N6320 Unspecified lump in the left breast, unspecified quadrant: Secondary | ICD-10-CM

## 2019-05-01 ENCOUNTER — Ambulatory Visit
Admission: RE | Admit: 2019-05-01 | Discharge: 2019-05-01 | Disposition: A | Discharge status: Home / Self Care | Payer: PPO | Source: Ambulatory Visit | Attending: Internal Medicine | Admitting: Internal Medicine

## 2019-05-01 DIAGNOSIS — N6342 Unspecified lump in left breast, subareolar: Secondary | ICD-10-CM | POA: Insufficient documentation

## 2019-05-01 DIAGNOSIS — N6320 Unspecified lump in the left breast, unspecified quadrant: Secondary | ICD-10-CM

## 2019-05-01 DIAGNOSIS — N6321 Unspecified lump in the left breast, upper outer quadrant: Secondary | ICD-10-CM | POA: Diagnosis not present

## 2019-05-01 DIAGNOSIS — R928 Other abnormal and inconclusive findings on diagnostic imaging of breast: Secondary | ICD-10-CM | POA: Diagnosis not present

## 2019-05-12 DIAGNOSIS — H353131 Nonexudative age-related macular degeneration, bilateral, early dry stage: Secondary | ICD-10-CM | POA: Diagnosis not present

## 2019-07-24 DIAGNOSIS — E782 Mixed hyperlipidemia: Secondary | ICD-10-CM | POA: Diagnosis not present

## 2019-07-24 DIAGNOSIS — E039 Hypothyroidism, unspecified: Secondary | ICD-10-CM | POA: Diagnosis not present

## 2019-07-31 DIAGNOSIS — I83893 Varicose veins of bilateral lower extremities with other complications: Secondary | ICD-10-CM | POA: Insufficient documentation

## 2019-07-31 DIAGNOSIS — I10 Essential (primary) hypertension: Secondary | ICD-10-CM | POA: Diagnosis not present

## 2019-07-31 DIAGNOSIS — Z0000 Encounter for general adult medical examination without abnormal findings: Secondary | ICD-10-CM | POA: Diagnosis not present

## 2019-07-31 DIAGNOSIS — E782 Mixed hyperlipidemia: Secondary | ICD-10-CM | POA: Diagnosis not present

## 2019-08-04 ENCOUNTER — Other Ambulatory Visit (INDEPENDENT_AMBULATORY_CARE_PROVIDER_SITE_OTHER): Payer: Self-pay | Admitting: Nurse Practitioner

## 2019-08-04 DIAGNOSIS — I83819 Varicose veins of unspecified lower extremities with pain: Secondary | ICD-10-CM

## 2019-08-12 ENCOUNTER — Ambulatory Visit (INDEPENDENT_AMBULATORY_CARE_PROVIDER_SITE_OTHER): Payer: PPO | Admitting: Nurse Practitioner

## 2019-08-12 ENCOUNTER — Encounter (INDEPENDENT_AMBULATORY_CARE_PROVIDER_SITE_OTHER): Payer: Self-pay | Admitting: Nurse Practitioner

## 2019-08-12 ENCOUNTER — Ambulatory Visit (INDEPENDENT_AMBULATORY_CARE_PROVIDER_SITE_OTHER): Payer: PPO

## 2019-08-12 ENCOUNTER — Encounter (INDEPENDENT_AMBULATORY_CARE_PROVIDER_SITE_OTHER): Payer: Self-pay

## 2019-08-12 ENCOUNTER — Other Ambulatory Visit: Payer: Self-pay

## 2019-08-12 VITALS — BP 165/81 | HR 93 | Resp 16 | Ht 59.0 in | Wt 142.0 lb

## 2019-08-12 DIAGNOSIS — I83819 Varicose veins of unspecified lower extremities with pain: Secondary | ICD-10-CM | POA: Diagnosis not present

## 2019-08-12 DIAGNOSIS — I10 Essential (primary) hypertension: Secondary | ICD-10-CM | POA: Diagnosis not present

## 2019-08-12 DIAGNOSIS — I83893 Varicose veins of bilateral lower extremities with other complications: Secondary | ICD-10-CM | POA: Diagnosis not present

## 2019-08-12 NOTE — Progress Notes (Signed)
Subjective:    Patient ID: Kathryn Kemp, female    DOB: [birth year], 79 y.o.   MRN: [id number] Chief Complaint  Patient presents with  . New Patient (Initial Visit)    varicose veins    The patient is seen for evaluation of symptomatic varicose veins. The patient relates burning and stinging which worsened steadily throughout the course of the day, particularly with standing. The patient also notes an aching and throbbing pain over the varicosities, particularly with prolonged dependent positions. The symptoms are significantly improved with elevation.  The patient also notes that during hot weather the symptoms are greatly intensified. The patient states the pain from the varicose veins interferes with work, daily exercise, shopping and household maintenance. At this point, the symptoms are persistent and severe enough that they're having a negative impact on lifestyle and are interfering with daily activities.  There is no history of DVT, PE or superficial thrombophlebitis. There is no history of ulceration or hemorrhage. The patient endorses a significant family history of varicose veins. OB history: G1P1  The patient has not worn graduated compression in the past. At the present time the patient has not been using over-the-counter analgesics. There is no history of prior surgical intervention or sclerotherapy.  Today the patient underwent noninvasive studies which showed evidence of venous reflux in the right lower extremity in the great saphenous vein at the proximal thigh extending to the distal thigh.  The vein diameters range from 0.29cm to 0.48 cm.  The left lower extremity has reflux in the great saphenous vein at the saphenofemoral junction extending to the proximal calf.  These vein diameters range from 0.51 cm to 0.74 cm.  The patient has no evidence of DVT or superficial venous thrombosis bilaterally   Review of Systems  Cardiovascular: Positive for leg swelling.   Scattered varicosities bilaterally  All other systems reviewed and are negative.      Objective:   Physical Exam Vitals reviewed.  Constitutional:      Appearance: Normal appearance.  Cardiovascular:     Rate and Rhythm: Normal rate and regular rhythm.     Pulses: Normal pulses.     Heart sounds: Normal heart sounds.  Pulmonary:     Effort: Pulmonary effort is normal.     Breath sounds: Normal breath sounds.  Neurological:     Mental Status: She is alert and oriented to person, place, and time.  Psychiatric:        Mood and Affect: Mood normal.        Behavior: Behavior normal.        Thought Content: Thought content normal.        Judgment: Judgment normal.     BP (!) 165/81 (BP Location: Right Arm)   Pulse 93   Resp 16   Ht 4\' 11"  (1.499 m)   Wt 142 lb (64.4 kg)   BMI 28.68 kg/m   Past Medical History:  Diagnosis Date  . Arthritis   . Cancer (HCC)    skin  . Esophagitis   . GERD (gastroesophageal reflux disease)   . Hemorrhoid    external  . Herpes zoster   . Hyperlipemia   . Hypertension   . Increased BMI   . Menopause   . Osteopenia   . Osteopenia   . Osteoporosis   . Shingles    h/o breast  . SUI (stress urinary incontinence, female)   . Thyroid disease     Social History  Socioeconomic History  . Marital status: Married    Spouse name: Not on file  . Number of children: Not on file  . Years of education: Not on file  . Highest education level: Not on file  Occupational History  . Not on file  Tobacco Use  . Smoking status: Never Smoker  . Smokeless tobacco: Never Used  Substance and Sexual Activity  . Alcohol use: No  . Drug use: No  . Sexual activity: Not Currently    Birth control/protection: Surgical  Other Topics Concern  . Not on file  Social History Narrative  . Not on file   Social Determinants of Health   Financial Resource Strain:   . Difficulty of Paying Living Expenses:   Food Insecurity:   . Worried About Community education officer in the Last Year:   . Barista in the Last Year:   Transportation Needs:   . Freight forwarder (Medical):   Marland Kitchen Lack of Transportation (Non-Medical):   Physical Activity:   . Days of Exercise per Week:   . Minutes of Exercise per Session:   Stress:   . Feeling of Stress :   Social Connections:   . Frequency of Communication with Friends and Family:   . Frequency of Social Gatherings with Friends and Family:   . Attends Religious Services:   . Active Member of Clubs or Organizations:   . Attends Banker Meetings:   Marland Kitchen Marital Status:   Intimate Partner Violence:   . Fear of Current or Ex-Partner:   . Emotionally Abused:   Marland Kitchen Physically Abused:   . Sexually Abused:     Past Surgical History:  Procedure Laterality Date  . CATARACT EXTRACTION    . COLONOSCOPY    . COLONOSCOPY WITH PROPOFOL N/A 09/18/2017   Procedure: COLONOSCOPY WITH PROPOFOL;  Surgeon: Scot Jun, MD;  [facility] ENDOSCOPY;  Service: Endoscopy;  Laterality: N/A;  . TUBAL LIGATION      Family History  Problem Relation Age of Onset  . Lung cancer Mother   . Heart disease Mother   . Osteoporosis Mother   . Prostate cancer Father   . Liver cancer Sister   . Breast cancer Neg Hx   . Diabetes Neg Hx   . Colon cancer Neg Hx   . Ovarian cancer Neg Hx     No Known Allergies     Assessment & Plan:   1. Symptomatic varicose veins of both lower extremities  Recommend:  The patient has large symptomatic varicose veins that are painful and associated with swelling.  I have had a long discussion with the patient regarding  varicose veins and why they cause symptoms.  Patient will begin wearing graduated compression stockings class 1 on a daily basis, beginning first thing in the morning and removing them in the evening. The patient is instructed specifically not to sleep in the stockings.    The patient  will also begin using over-the-counter analgesics such as Motrin  600 mg po TID to help control the symptoms.    In addition, behavioral modification including elevation during the day will be initiated.    Pending the results of these changes the  patient will be reevaluated in three months.   An  ultrasound of the venous system will be obtained.   Further plans will be based on the ultrasound results and whether conservative therapies are successful at eliminating the pain and swelling.   2.  Benign essential hypertension Continue antihypertensive medications as already ordered, these medications have been reviewed and there are no changes at this time.    Current Outpatient Medications on File Prior to Visit  Medication Sig Dispense Refill  . Calcium-Vitamin D 600-200 MG-UNIT tablet Take by mouth.    . hydrochlorothiazide (HYDRODIURIL) 25 MG tablet Take 25 mg by mouth daily.    . Multiple Vitamins-Minerals (PRESERVISION AREDS 2 PO) Take by mouth.    . pravastatin (PRAVACHOL) 40 MG tablet Take 40 mg by mouth daily.    Marland Kitchen omeprazole (PRILOSEC) 20 MG capsule Take by mouth.     No current facility-administered medications on file prior to visit.    There are no Patient Instructions on file for this visit. No follow-ups on file.   Georgiana Spinner, NP

## 2019-09-24 DIAGNOSIS — L821 Other seborrheic keratosis: Secondary | ICD-10-CM | POA: Diagnosis not present

## 2019-09-24 DIAGNOSIS — Z85828 Personal history of other malignant neoplasm of skin: Secondary | ICD-10-CM | POA: Diagnosis not present

## 2019-09-24 DIAGNOSIS — Z872 Personal history of diseases of the skin and subcutaneous tissue: Secondary | ICD-10-CM | POA: Diagnosis not present

## 2019-09-24 DIAGNOSIS — Z85820 Personal history of malignant melanoma of skin: Secondary | ICD-10-CM | POA: Diagnosis not present

## 2019-09-24 DIAGNOSIS — L578 Other skin changes due to chronic exposure to nonionizing radiation: Secondary | ICD-10-CM | POA: Diagnosis not present

## 2019-09-24 DIAGNOSIS — L720 Epidermal cyst: Secondary | ICD-10-CM | POA: Diagnosis not present

## 2019-10-08 DIAGNOSIS — R1031 Right lower quadrant pain: Secondary | ICD-10-CM | POA: Diagnosis not present

## 2019-11-12 ENCOUNTER — Encounter (INDEPENDENT_AMBULATORY_CARE_PROVIDER_SITE_OTHER): Payer: Self-pay | Admitting: Nurse Practitioner

## 2019-11-12 ENCOUNTER — Ambulatory Visit (INDEPENDENT_AMBULATORY_CARE_PROVIDER_SITE_OTHER): Payer: PPO | Admitting: Nurse Practitioner

## 2019-11-12 ENCOUNTER — Other Ambulatory Visit: Payer: Self-pay

## 2019-11-12 VITALS — BP 167/73 | HR 98 | Resp 16 | Wt 145.6 lb

## 2019-11-12 DIAGNOSIS — I10 Essential (primary) hypertension: Secondary | ICD-10-CM | POA: Diagnosis not present

## 2019-11-12 DIAGNOSIS — I83819 Varicose veins of unspecified lower extremities with pain: Secondary | ICD-10-CM | POA: Diagnosis not present

## 2019-11-12 DIAGNOSIS — E782 Mixed hyperlipidemia: Secondary | ICD-10-CM

## 2019-11-12 NOTE — Patient Instructions (Signed)
Endovenous Ablation Endovenous ablation is a procedure that seals off an abnormally enlarged leg vein (varicose vein). This procedure uses heat from radiofrequency waves or a laser to close off the affected vein. This procedure may be done if the vein is causing pain, swelling, sores on the skin (ulcers), or skin discoloration. Closing off the vein can help reduce these symptoms by preventing the pooling of blood that causes varicose veins. Tell a health care provider about:  Any allergies you have.  All medicines you are taking, including vitamins, herbs, eye drops, creams, and over-the-counter medicines.  Any problems you or family members have had with anesthetic medicines.  Any blood disorders you have.  Any surgeries you have had.  Any medical conditions you have or have had.  Whether you are pregnant or may be pregnant. What are the risks? Generally, this is a safe procedure. However, problems may occur, including:  Infection.  Bleeding.  Allergic reactions to medicines.  Damage to other structures.  Numbness or tingling along the leg. This is uncommon, and it is usually temporary.  Vein swelling. This is usually temporary.  Blood clots that form in a deep vein of the leg (deep vein thrombosis, or DVT) and can travel to the lungs (pulmonary embolism, or PE). This is very rare. What happens before the procedure? Medicines Ask your health care provider about:  Changing or stopping your regular medicines. This is especially important if you are taking diabetes medicines or blood thinners.  Taking medicines such as aspirin and ibuprofen. These medicines can thin your blood. Do not take these medicines unless your health care provider tells you to take them.  Taking over-the-counter medicines, vitamins, herbs, and supplements. Eating and drinking Follow instructions from your health care provider about eating and drinking, which may include:  8 hours before the procedure  - stop eating heavy meals or foods, such as meat, fried foods, or fatty foods.  6 hours before the procedure - stop eating light meals or foods, such as toast or cereal.  6 hours before the procedure - stop drinking milk or drinks that contain milk.  2 hours before the procedure - stop drinking clear liquids. Staying hydrated Follow instructions from your health care provider about hydration, which may include:  Up to 2 hours before the procedure - you may continue to drink clear liquids, such as water, clear fruit juice, black coffee, and plain tea.  General instructions  You may have blood tests to make sure that your blood can clot normally.  Plan to have someone take you home from the hospital or clinic.  If you will be going home right after the procedure, plan to have someone with you for 24 hours.  Do not use any products that contain nicotine or tobacco for at least 4 weeks before the procedure. These products include cigarettes, e-cigarettes, and chewing tobacco. If you need help quitting, ask your health care provider.  Ask your health care provider what steps will be taken to help prevent infection. These may include: ? Removing hair at the surgery site. ? Washing skin with a germ-killing soap. What happens during the procedure?   You will lie on an exam table.  An IV will be inserted into one of your veins.  You will be given: ? A medicine to help you relax (sedative). ? A medicine to numb the area (local anesthetic).  Your health care provider will use an imaging tool that uses sound waves (ultrasonogram) to show images  of your leg veins.  A small incision will be made near the area that will be treated. A narrow tube (catheter) will be slipped through the incision and into the vein.  Small sensors (electrodes or laser fibers) will be passed through the catheter and into the vein.  Radiofrequency or laser energy will be sent through the sensors to burn the vein.  This seals off the vein.  The electrodes, laser fibers, and catheter will be removed from the vein.  A bandage (dressing) will be placed over the incision. The procedure may vary among health care providers and hospitals. What happens after the procedure?  Your blood pressure, heart rate, breathing rate, and blood oxygen level may be monitored until you leave the hospital or clinic.  You may have to wear compression stockings. These stockings help to prevent blood clots and reduce swelling in your legs.  You will be encouraged to walk around right after the procedure.  Do not drive for 24 hours if you were given a sedative during your procedure. Summary  Endovenous ablation is a procedure that seals off an abnormally enlarged leg vein (varicose vein).  Follow instructions from your health care provider about changing or stopping your medicines. This is especially important if you are taking diabetes medicines or blood thinners.  Follow instructions from your health care provider about eating and drinking before the procedure.  Plan to have someone take you home from the hospital or clinic.  After the procedure, you may have to wear compression stockings, and you will be encouraged to walk around right away. This information is not intended to replace advice given to you by your health care provider. Make sure you discuss any questions you have with your health care provider. Document Revised: 12/04/2017 Document Reviewed: 12/04/2017 Elsevier Patient Education  [place] After This sheet gives you information about how to care for yourself after your procedure. Your health care provider may also give you more specific instructions. If you have problems or questions, contact your health care provider. What can I expect after the procedure? After the procedure, it is common to have:  Bruising.  Tenderness. Follow these instructions at  home: Incision care   Follow instructions from your health care provider about how to take care of your incision. Make sure you: ? Wash your hands with soap and water before and after you change your bandage (dressing). If soap and water are not available, use hand sanitizer. ? Change your dressing as told by your health care provider. ? Follow instructions from your health care provider about when you should remove your dressing.  Check your incision area every day for signs of infection. Check for: ? Redness, swelling, or pain. ? Fluid or blood. ? Warmth. ? Pus or a bad smell.  Keep the dressing dry until your health care provider says it can be removed. Activity  Avoid sitting for a long time without moving. Get up to take short walks every 1-2 hours. This is important to improve blood flow. Ask for help if you feel weak or unsteady.  Rest as told by your health care provider. General instructions  Take over-the-counter and prescription medicines only as told by your health care provider.  Do not drive for 24 hours if you were given a sedative during your procedure.  Do not take long car trips or travel by air until your health care provider has approved.  Wear compression stockings as told by  your health care provider. These stockings help to prevent blood clots and reduce swelling in your legs.  Do not use any products that contain nicotine or tobacco, such as cigarettes, e-cigarettes, and chewing tobacco. These can delay incision healing after the procedure. If you need help quitting, ask your health care provider.  Keep all follow-up visits as told by your health care provider. This is important. Contact a health care provider if you have:  A fever.  Redness, swelling, or pain at the site of your incision.  Fluid or blood coming from your incision.  Warmth at the incision area.  Pus or a bad smell coming from your incision. Get help right away if you:  Notice red  streaks coming from the incision.  Develop nausea or vomiting.  Have trouble breathing.  Develop chest pain. Summary  Follow instructions from your health care provider about how to take care of your incision.  Avoid sitting for a long time without moving. Get up to take short walks every 1-2 hours.  Wear compression stockings as told by your health care provider. These stockings help to prevent blood clots and reduce swelling in your legs.  Contact a health care provider if you have redness, swelling, or pain at the site of your incision.  Keep all follow-up visits as told by your health care provider. This is important. This information is not intended to replace advice given to you by your health care provider. Make sure you discuss any questions you have with your health care provider. Document Revised: 12/04/2017 Document Reviewed: 12/04/2017 Elsevier Patient Education  2020 [employer].

## 2019-11-16 ENCOUNTER — Encounter (INDEPENDENT_AMBULATORY_CARE_PROVIDER_SITE_OTHER): Payer: Self-pay | Admitting: Nurse Practitioner

## 2019-11-16 NOTE — Progress Notes (Signed)
Subjective:    Patient ID: Kathryn Kemp, female    DOB: 01/21/1941, 79 y.o.   MRN: [id number] Chief Complaint  Patient presents with  . Follow-up    63month follow up    The patient returns for followup evaluation 3 months after the initial visit. The patient continues to have pain in the lower extremities with dependency. The pain is lessened with elevation. Graduated compression stockings, Class I (20-30 mmHg), have been worn but the stockings do not eliminate the leg pain. Over-the-counter analgesics do not improve the symptoms. The degree of discomfort continues to interfere with daily activities. The patient notes the pain in the legs is causing problems with daily exercise, at the workplace and even with household activities and maintenance such as standing in the kitchen preparing meals and doing dishes.   Venous ultrasound shows normal deep venous system, no evidence of acute or chronic DVT.  Previous noninvasive studies on 08/12/2019, which showed evidence of venous reflux in the right lower extremity in the great saphenous vein at the proximal thigh extending to the distal thigh.  The vein diameters range from 0.29cm to 0.48 cm.  The left lower extremity has reflux in the great saphenous vein at the saphenofemoral junction extending to the proximal calf.  These vein diameters range from 0.51 cm to 0.74 cm.  The patient has no evidence of DVT or superficial venous thrombosis bilaterally   Review of Systems  Cardiovascular: Positive for leg swelling.       Scattered varicosities bilaterally with large varicosity on the left lower extremity  All other systems reviewed and are negative.      Objective:   Physical Exam Vitals reviewed.  HENT:     Head: Normocephalic.  Cardiovascular:     Rate and Rhythm: Normal rate and regular rhythm.     Pulses: Normal pulses.     Heart sounds: Normal heart sounds.  Pulmonary:     Effort: Pulmonary effort is normal.     Breath sounds: Normal  breath sounds.  Musculoskeletal:        General: Normal range of motion.  Skin:    General: Skin is warm and dry.  Neurological:     Mental Status: She is alert and oriented to person, place, and time.  Psychiatric:        Mood and Affect: Mood normal.        Behavior: Behavior normal.        Thought Content: Thought content normal.        Judgment: Judgment normal.     BP (!) 167/73 (BP Location: Left Arm)   Pulse 98   Resp 16   Wt 145 lb 9.6 oz (66 kg)   BMI 29.41 kg/m   Past Medical History:  Diagnosis Date  . Arthritis   . Cancer (HCC)    skin  . Esophagitis   . GERD (gastroesophageal reflux disease)   . Hemorrhoid    external  . Herpes zoster   . Hyperlipemia   . Hypertension   . Increased BMI   . Menopause   . Osteopenia   . Osteopenia   . Osteoporosis   . Shingles    h/o breast  . SUI (stress urinary incontinence, female)   . Thyroid disease     Social History   Socioeconomic History  . Marital status: Married    Spouse name: Not on file  . Number of children: Not on file  . Years of education:  Not on file  . Highest education level: Not on file  Occupational History  . Not on file  Tobacco Use  . Smoking status: Never Smoker  . Smokeless tobacco: Never Used  Substance and Sexual Activity  . Alcohol use: No  . Drug use: No  . Sexual activity: Not Currently    Birth control/protection: Surgical  Other Topics Concern  . Not on file  Social History Narrative  . Not on file   Social Determinants of Health   Financial Resource Strain:   . Difficulty of Paying Living Expenses:   Food Insecurity:   . Worried About Programme researcher, broadcasting/film/video in the Last Year:   . Barista in the Last Year:   Transportation Needs:   . Freight forwarder (Medical):   Marland Kitchen Lack of Transportation (Non-Medical):   Physical Activity:   . Days of Exercise per Week:   . Minutes of Exercise per Session:   Stress:   . Feeling of Stress :   Social Connections:    . Frequency of Communication with Friends and Family:   . Frequency of Social Gatherings with Friends and Family:   . Attends Religious Services:   . Active Member of Clubs or Organizations:   . Attends Banker Meetings:   Marland Kitchen Marital Status:   Intimate Partner Violence:   . Fear of Current or Ex-Partner:   . Emotionally Abused:   Marland Kitchen Physically Abused:   . Sexually Abused:     Past Surgical History:  Procedure Laterality Date  . CATARACT EXTRACTION    . COLONOSCOPY    . COLONOSCOPY WITH PROPOFOL N/A 09/18/2017   Procedure: COLONOSCOPY WITH PROPOFOL;  Surgeon: Scot Jun, MD;  Location: [facility] ENDOSCOPY;  Service: Endoscopy;  Laterality: N/A;  . TUBAL LIGATION      Family History  Problem Relation Age of Onset  . Lung cancer Mother   . Heart disease Mother   . Osteoporosis Mother   . Prostate cancer Father   . Liver cancer Sister   . Breast cancer Neg Hx   . Diabetes Neg Hx   . Colon cancer Neg Hx   . Ovarian cancer Neg Hx     No Known Allergies     Assessment & Plan:   1. Varicose veins with pain   Recommend  I have reviewed my previous  discussion with the patient regarding  varicose veins and why they cause symptoms. Patient will continue  wearing graduated compression stockings class 1 on a daily basis, beginning first thing in the morning and removing them in the evening.    In addition, behavioral modification including elevation during the day was again discussed and this will continue.  The patient has utilized over the counter pain medications and has been exercising.  However, at this time conservative therapy has not alleviated the patient's symptoms of leg pain and swelling  Recommend: laser ablation of the right and  left great saphenous veins to eliminate the symptoms of pain and swelling of the lower extremities caused by the severe superficial venous reflux disease.   The patient would like more time to consider undergoing  endovenous laser ablation.  The patient will contact her office if she wishes to proceed.  2. Benign essential hypertension Continue antihypertensive medications as already ordered, these medications have been reviewed and there are no changes at this time.   3. Combined hyperlipidemia Continue statin as ordered and reviewed, no changes at this time  Current Outpatient Medications on File Prior to Visit  Medication Sig Dispense Refill  . Calcium-Vitamin D 600-200 MG-UNIT tablet Take by mouth.    . hydrochlorothiazide (HYDRODIURIL) 25 MG tablet Take 25 mg by mouth daily.    . Multiple Vitamins-Minerals (PRESERVISION AREDS 2 PO) Take by mouth.    Marland Kitchen omeprazole (PRILOSEC) 20 MG capsule Take by mouth.    . pravastatin (PRAVACHOL) 40 MG tablet Take 40 mg by mouth daily.     No current facility-administered medications on file prior to visit.    Patient Instructions  Endovenous Ablation Endovenous ablation is a procedure that seals off an abnormally enlarged leg vein (varicose vein). This procedure uses heat from radiofrequency waves or a laser to close off the affected vein. This procedure may be done if the vein is causing pain, swelling, sores on the skin (ulcers), or skin discoloration. Closing off the vein can help reduce these symptoms by preventing the pooling of blood that causes varicose veins. Tell a health care provider about:  Any allergies you have.  All medicines you are taking, including vitamins, herbs, eye drops, creams, and over-the-counter medicines.  Any problems you or family members have had with anesthetic medicines.  Any blood disorders you have.  Any surgeries you have had.  Any medical conditions you have or have had.  Whether you are pregnant or may be pregnant. What are the risks? Generally, this is a safe procedure. However, problems may occur, including:  Infection.  Bleeding.  Allergic reactions to medicines.  Damage to other  structures.  Numbness or tingling along the leg. This is uncommon, and it is usually temporary.  Vein swelling. This is usually temporary.  Blood clots that form in a deep vein of the leg (deep vein thrombosis, or DVT) and can travel to the lungs (pulmonary embolism, or PE). This is very rare. What happens before the procedure? Medicines Ask your health care provider about:  Changing or stopping your regular medicines. This is especially important if you are taking diabetes medicines or blood thinners.  Taking medicines such as aspirin and ibuprofen. These medicines can thin your blood. Do not take these medicines unless your health care provider tells you to take them.  Taking over-the-counter medicines, vitamins, herbs, and supplements. Eating and drinking Follow instructions from your health care provider about eating and drinking, which may include:  8 hours before the procedure - stop eating heavy meals or foods, such as meat, fried foods, or fatty foods.  6 hours before the procedure - stop eating light meals or foods, such as toast or cereal.  6 hours before the procedure - stop drinking milk or drinks that contain milk.  2 hours before the procedure - stop drinking clear liquids. Staying hydrated Follow instructions from your health care provider about hydration, which may include:  Up to 2 hours before the procedure - you may continue to drink clear liquids, such as water, clear fruit juice, black coffee, and plain tea.  General instructions  You may have blood tests to make sure that your blood can clot normally.  Plan to have someone take you home from the hospital or clinic.  If you will be going home right after the procedure, plan to have someone with you for 24 hours.  Do not use any products that contain nicotine or tobacco for at least 4 weeks before the procedure. These products include cigarettes, e-cigarettes, and chewing tobacco. If you need help quitting,  ask your health care  provider.  Ask your health care provider what steps will be taken to help prevent infection. These may include: ? Removing hair at the surgery site. ? Washing skin with a germ-killing soap. What happens during the procedure?   You will lie on an exam table.  An IV will be inserted into one of your veins.  You will be given: ? A medicine to help you relax (sedative). ? A medicine to numb the area (local anesthetic).  Your health care provider will use an imaging tool that uses sound waves (ultrasonogram) to show images of your leg veins.  A small incision will be made near the area that will be treated. A narrow tube (catheter) will be slipped through the incision and into the vein.  Small sensors (electrodes or laser fibers) will be passed through the catheter and into the vein.  Radiofrequency or laser energy will be sent through the sensors to burn the vein. This seals off the vein.  The electrodes, laser fibers, and catheter will be removed from the vein.  A bandage (dressing) will be placed over the incision. The procedure may vary among health care providers and hospitals. What happens after the procedure?  Your blood pressure, heart rate, breathing rate, and blood oxygen level may be monitored until you leave the hospital or clinic.  You may have to wear compression stockings. These stockings help to prevent blood clots and reduce swelling in your legs.  You will be encouraged to walk around right after the procedure.  Do not drive for 24 hours if you were given a sedative during your procedure. Summary  Endovenous ablation is a procedure that seals off an abnormally enlarged leg vein (varicose vein).  Follow instructions from your health care provider about changing or stopping your medicines. This is especially important if you are taking diabetes medicines or blood thinners.  Follow instructions from your health care provider about eating and  drinking before the procedure.  Plan to have someone take you home from the hospital or clinic.  After the procedure, you may have to wear compression stockings, and you will be encouraged to walk around right away. This information is not intended to replace advice given to you by your health care provider. Make sure you discuss any questions you have with your health care provider. Document Revised: 12/04/2017 Document Reviewed: 12/04/2017 Elsevier Patient Education  2020 [employer]  Endovenous Ablation, Care After This sheet gives you information about how to care for yourself after your procedure. Your health care provider may also give you more specific instructions. If you have problems or questions, contact your health care provider. What can I expect after the procedure? After the procedure, it is common to have:  Bruising.  Tenderness. Follow these instructions at home: Incision care   Follow instructions from your health care provider about how to take care of your incision. Make sure you: ? Wash your hands with soap and water before and after you change your bandage (dressing). If soap and water are not available, use hand sanitizer. ? Change your dressing as told by your health care provider. ? Follow instructions from your health care provider about when you should remove your dressing.  Check your incision area every day for signs of infection. Check for: ? Redness, swelling, or pain. ? Fluid or blood. ? Warmth. ? Pus or a bad smell.  Keep the dressing dry until your health care provider says it can be removed. Activity  Avoid sitting  for a long time without moving. Get up to take short walks every 1-2 hours. This is important to improve blood flow. Ask for help if you feel weak or unsteady.  Rest as told by your health care provider. General instructions  Take over-the-counter and prescription medicines only as told by your health care provider.  Do not  drive for 24 hours if you were given a sedative during your procedure.  Do not take long car trips or travel by air until your health care provider has approved.  Wear compression stockings as told by your health care provider. These stockings help to prevent blood clots and reduce swelling in your legs.  Do not use any products that contain nicotine or tobacco, such as cigarettes, e-cigarettes, and chewing tobacco. These can delay incision healing after the procedure. If you need help quitting, ask your health care provider.  Keep all follow-up visits as told by your health care provider. This is important. Contact a health care provider if you have:  A fever.  Redness, swelling, or pain at the site of your incision.  Fluid or blood coming from your incision.  Warmth at the incision area.  Pus or a bad smell coming from your incision. Get help right away if you:  Notice red streaks coming from the incision.  Develop nausea or vomiting.  Have trouble breathing.  Develop chest pain. Summary  Follow instructions from your health care provider about how to take care of your incision.  Avoid sitting for a long time without moving. Get up to take short walks every 1-2 hours.  Wear compression stockings as told by your health care provider. These stockings help to prevent blood clots and reduce swelling in your legs.  Contact a health care provider if you have redness, swelling, or pain at the site of your incision.  Keep all follow-up visits as told by your health care provider. This is important. This information is not intended to replace advice given to you by your health care provider. Make sure you discuss any questions you have with your health care provider. Document Revised: 12/04/2017 Document Reviewed: 12/04/2017 Elsevier Patient Education  2020 [employer].   No follow-ups on file.   Georgiana Spinner, NP

## 2020-01-05 DIAGNOSIS — M65331 Trigger finger, right middle finger: Secondary | ICD-10-CM | POA: Diagnosis not present

## 2020-01-05 DIAGNOSIS — M8949 Other hypertrophic osteoarthropathy, multiple sites: Secondary | ICD-10-CM | POA: Diagnosis not present

## 2020-01-21 IMAGING — MG DIGITAL DIAGNOSTIC UNILATERAL LEFT MAMMOGRAM WITH TOMO AND CAD
4 series · 4 of 12 positions shown · non-contrast
Comparison: Previous exam(s).

CLINICAL DATA: Possible asymmetry in the outer left breast
anteriorly in the craniocaudal projection of a recent screening
mammogram.

EXAM:
DIGITAL DIAGNOSTIC LEFT MAMMOGRAM WITH CAD AND TOMO
ULTRASOUND LEFT BREAST

[L ML synth-2D]
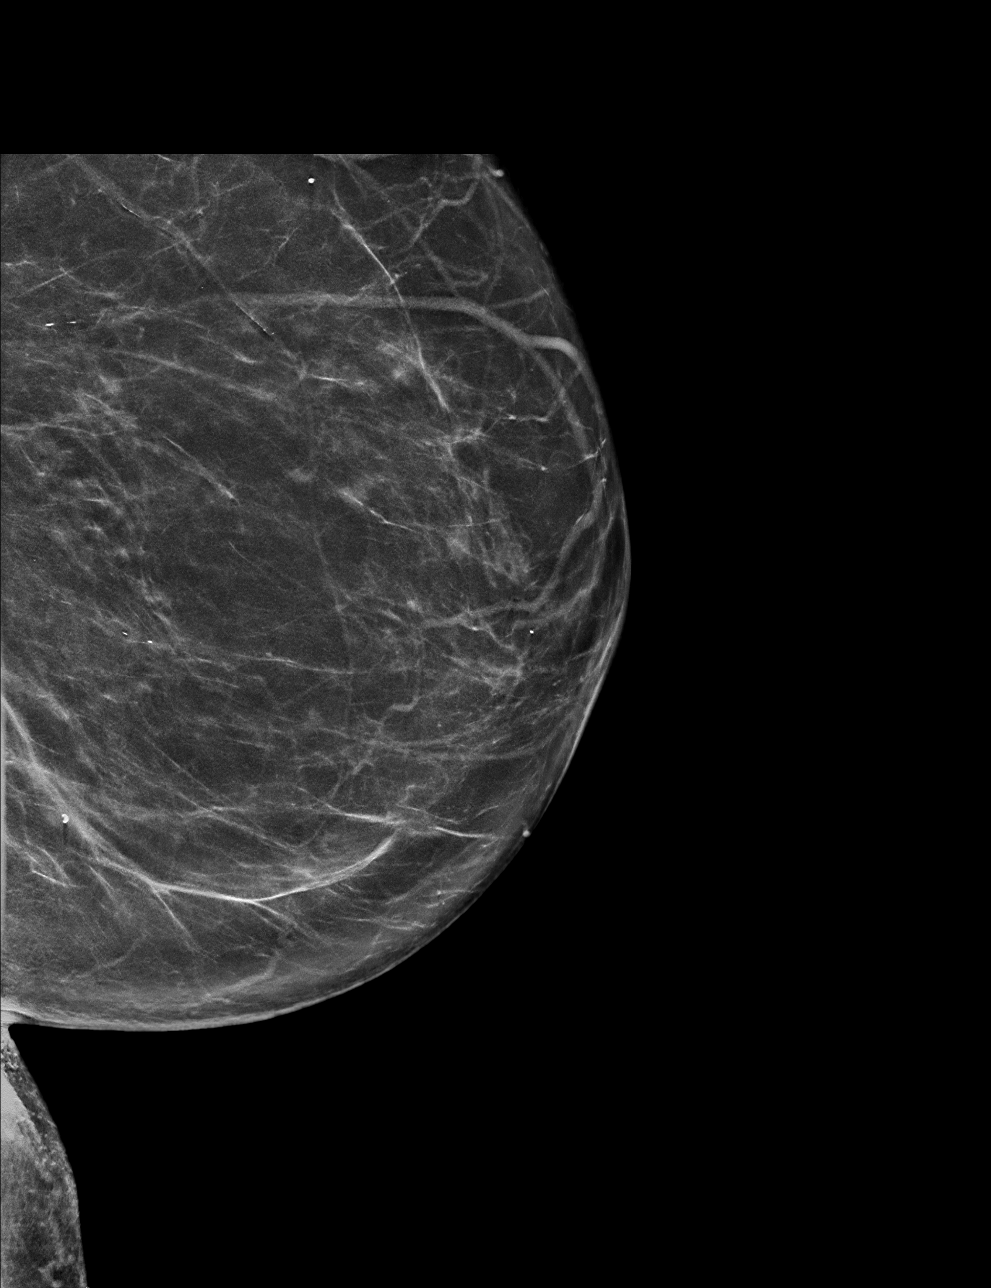

[L CC synth-2D]
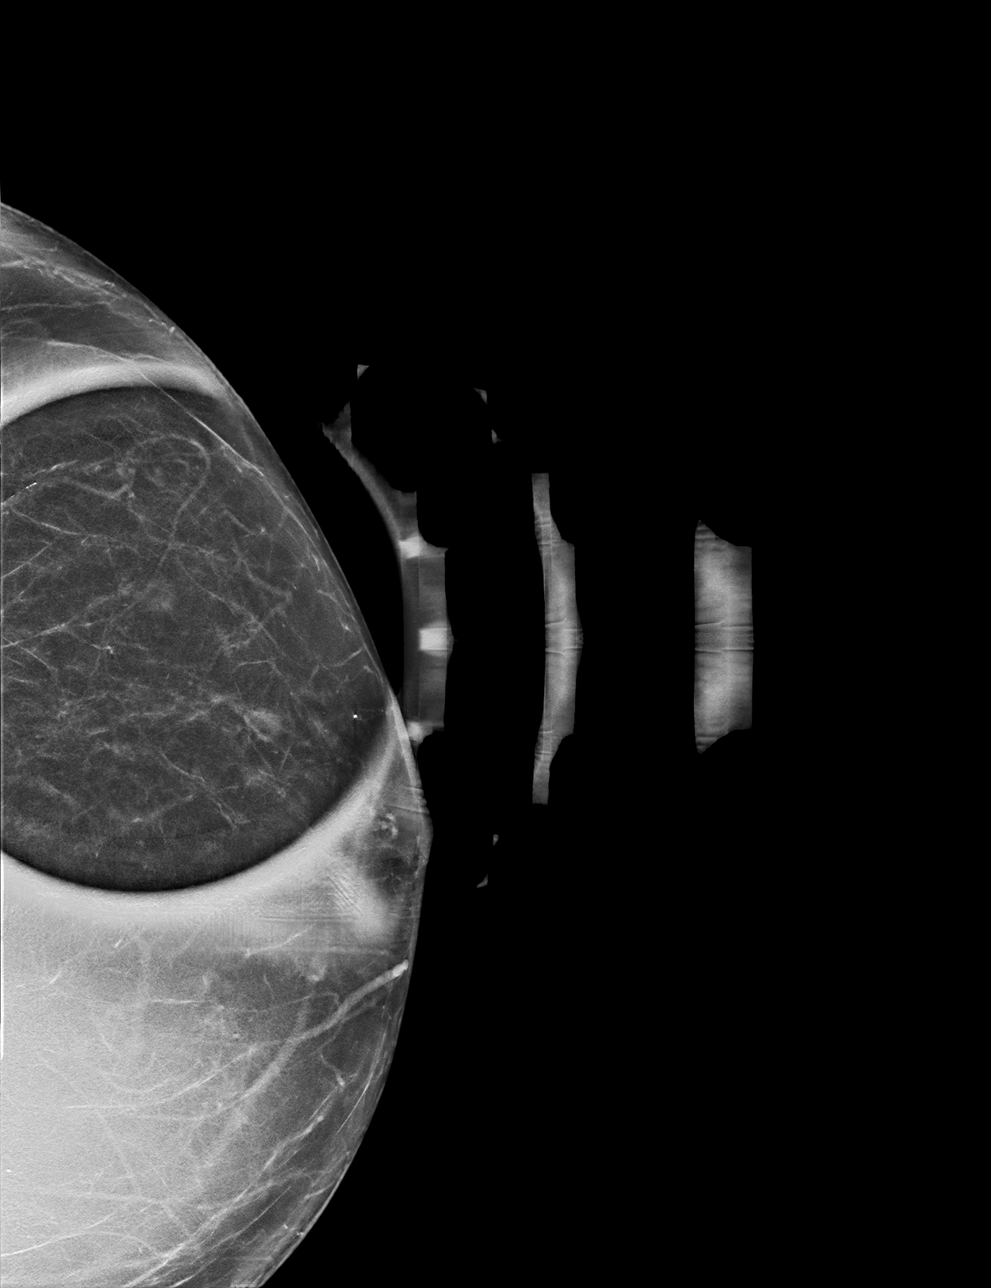

[L ML tomo · tomo slice 38/75.0]
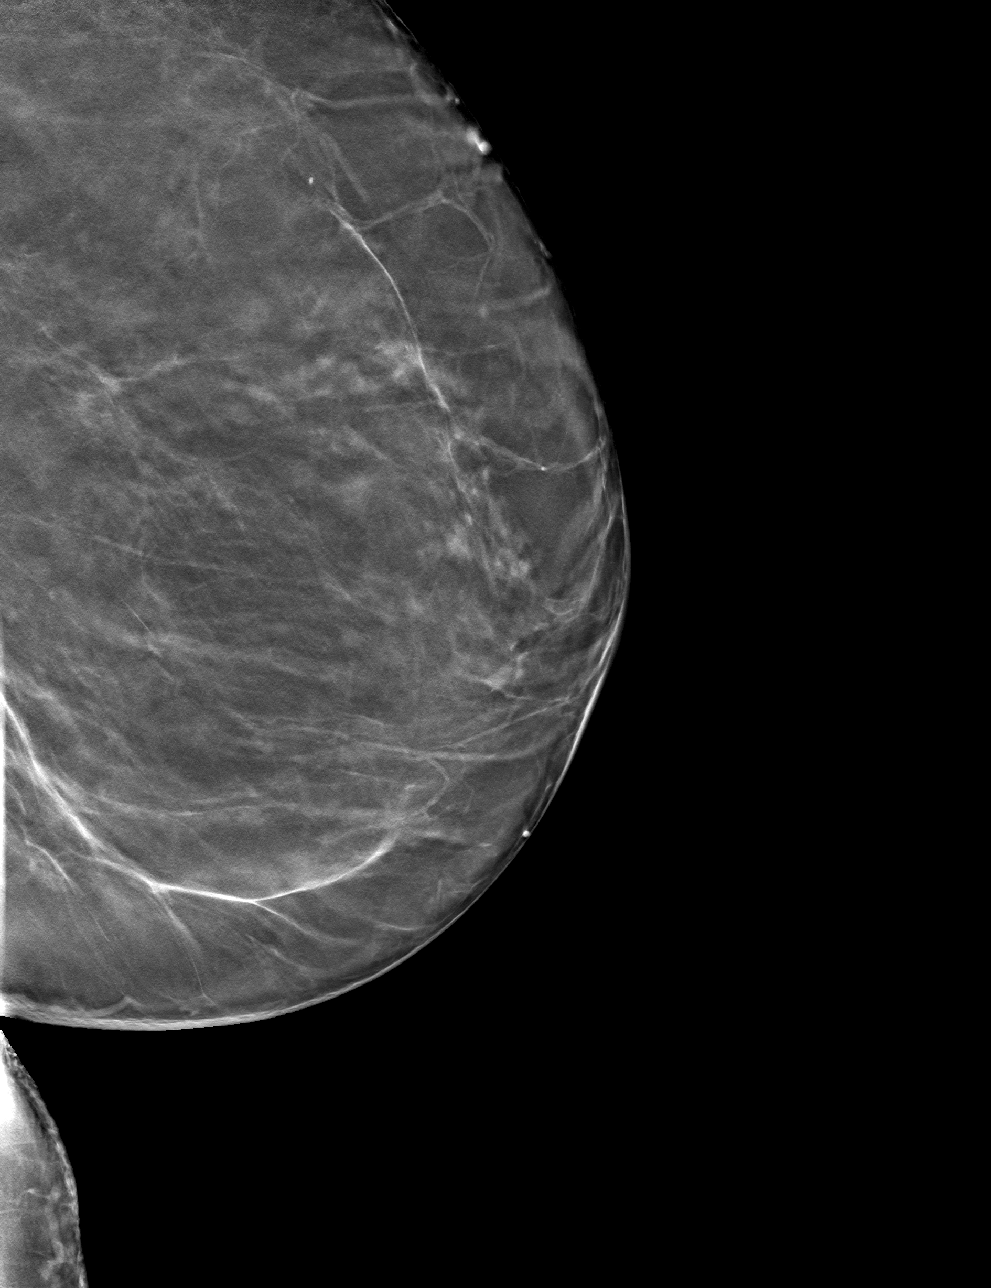

[L CC tomo · tomo slice 31/60.0]
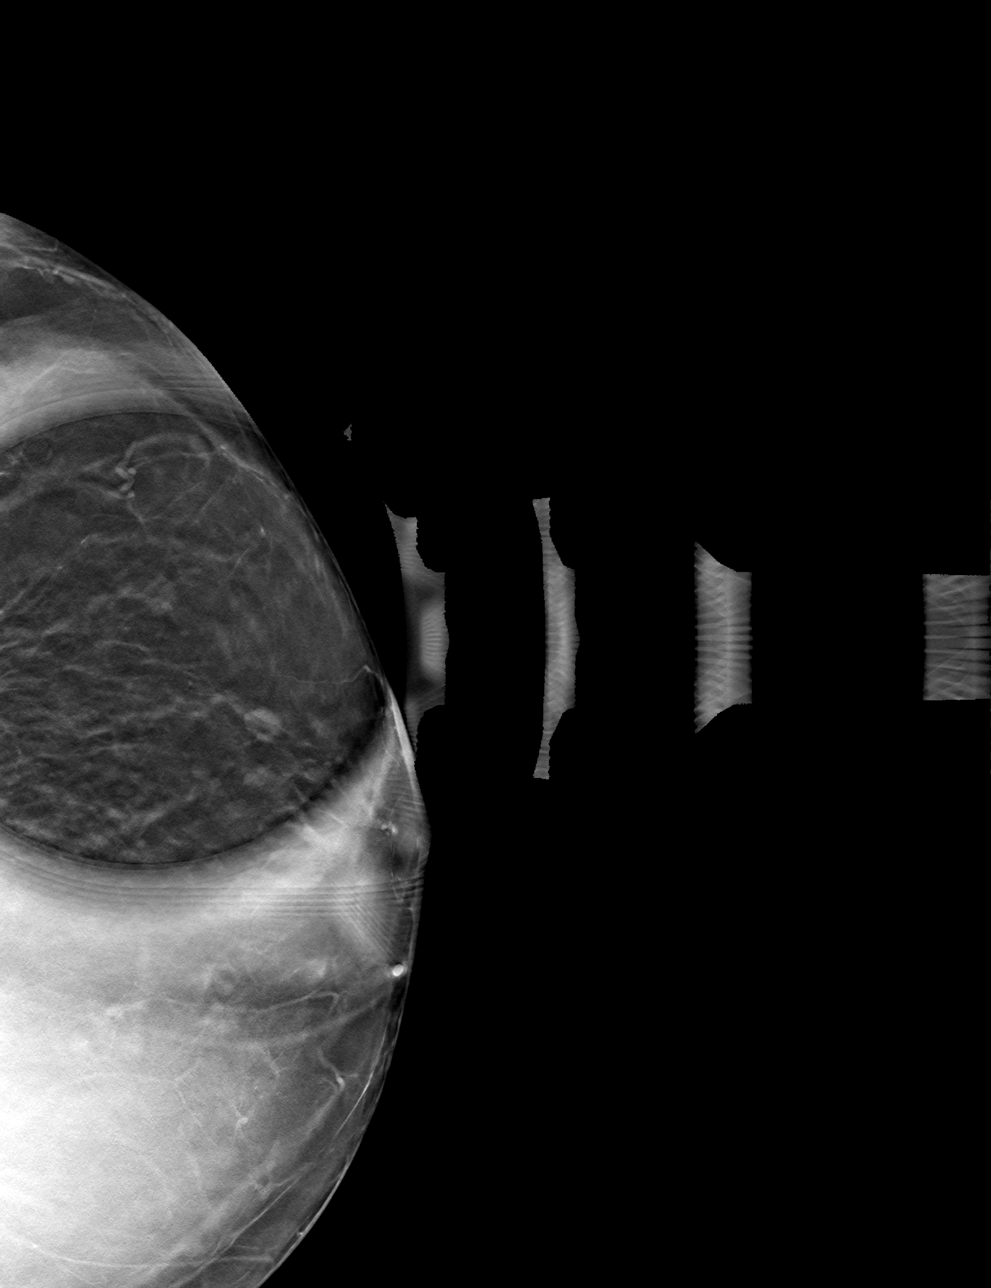

[4 of 12 positions shown; findings below may reference images not displayed]

ACR Breast Density Category b: There are scattered areas of
fibroglandular density.
FINDINGS: 3D tomographic and 2D generated true lateral and spot compression
craniocaudal views of the left breast were obtained. These
demonstrate normal appearing breast tissue at the location of
recently suspected asymmetry.

Mammographic images were processed with CAD.

On physical exam, no mass is palpable in the outer retroareolar left
breast.

Targeted ultrasound is performed, showing a 9 x 6 x 2 mm oval,
horizontally oriented, circumscribed, hypoechoic mass in the 2
o'clock retroareolar left breast. This corresponds to the area
mammographic concern. No internal blood flow was seen with power
Doppler.
IMPRESSION: 9 mm probable mildly complicated cyst in the 2 o'clock retroareolar
left breast. An elongated fibroadenoma or papilloma are less likely
considerations.

RECOMMENDATION:
Left breast ultrasound in 6 months. The option of ultrasound-guided
core needle biopsy was also discussed with the patient but not
recommended at this time. She is currently comfortable with the 6
month followup ultrasound.

I have discussed the findings and recommendations with the patient.
Results were also provided in writing at the conclusion of the
visit. If applicable, a reminder letter will be sent to the patient
regarding the next appointment.

BI-RADS CATEGORY  3: Probably benign.

## 2020-04-13 ENCOUNTER — Other Ambulatory Visit: Payer: Self-pay | Admitting: Internal Medicine

## 2020-04-13 DIAGNOSIS — N6320 Unspecified lump in the left breast, unspecified quadrant: Secondary | ICD-10-CM

## 2020-05-02 ENCOUNTER — Other Ambulatory Visit: Payer: Self-pay | Admitting: Internal Medicine

## 2020-05-02 ENCOUNTER — Ambulatory Visit
Admission: RE | Admit: 2020-05-02 | Discharge: 2020-05-02 | Disposition: A | Discharge status: Home / Self Care | Payer: PPO | Source: Ambulatory Visit | Attending: Internal Medicine | Admitting: Internal Medicine

## 2020-05-02 ENCOUNTER — Other Ambulatory Visit: Payer: Self-pay

## 2020-05-02 DIAGNOSIS — N6321 Unspecified lump in the left breast, upper outer quadrant: Secondary | ICD-10-CM | POA: Diagnosis not present

## 2020-05-02 DIAGNOSIS — R928 Other abnormal and inconclusive findings on diagnostic imaging of breast: Secondary | ICD-10-CM | POA: Diagnosis not present

## 2020-05-02 DIAGNOSIS — N6320 Unspecified lump in the left breast, unspecified quadrant: Secondary | ICD-10-CM

## 2020-07-26 DIAGNOSIS — E782 Mixed hyperlipidemia: Secondary | ICD-10-CM | POA: Diagnosis not present

## 2020-08-02 DIAGNOSIS — M65331 Trigger finger, right middle finger: Secondary | ICD-10-CM | POA: Diagnosis not present

## 2020-08-02 DIAGNOSIS — Z0000 Encounter for general adult medical examination without abnormal findings: Secondary | ICD-10-CM | POA: Diagnosis not present

## 2020-08-02 DIAGNOSIS — I10 Essential (primary) hypertension: Secondary | ICD-10-CM | POA: Diagnosis not present

## 2020-08-02 DIAGNOSIS — E782 Mixed hyperlipidemia: Secondary | ICD-10-CM | POA: Diagnosis not present

## 2020-08-04 DIAGNOSIS — M65331 Trigger finger, right middle finger: Secondary | ICD-10-CM | POA: Diagnosis not present

## 2020-08-11 ENCOUNTER — Other Ambulatory Visit (INDEPENDENT_AMBULATORY_CARE_PROVIDER_SITE_OTHER): Payer: PPO | Admitting: Vascular Surgery

## 2020-08-18 ENCOUNTER — Encounter (INDEPENDENT_AMBULATORY_CARE_PROVIDER_SITE_OTHER): Payer: PPO

## 2020-08-23 NOTE — Progress Notes (Signed)
MRN : [id number]  Kathryn Kemp is a 80 y.o. (13-Dec-1940) female who presents with chief complaint of painful varicose veins.    The patient's right lower extremity was sterilely prepped and draped.  The ultrasound machine was used to visualize the right great saphenous vein throughout its course.  A segment at the knee was selected for access.  The saphenous vein was accessed without difficulty using ultrasound guidance with a micropuncture needle.   An 0.018  wire was placed beyond the saphenofemoral junction through the sheath and the microneedle was removed.  The 65 cm sheath was then placed over the wire and the wire and dilator were removed.  The laser fiber was placed through the sheath and its tip was placed approximately 2 cm below the saphenofemoral junction.  Tumescent anesthesia was then created with a dilute lidocaine solution.  Laser energy was then delivered with constant withdrawal of the sheath and laser fiber.  Approximately 1142 Joules of energy were delivered over a length of 24 cm.  Sterile dressings were placed.  The patient tolerated the procedure well without complications.

## 2020-08-25 ENCOUNTER — Ambulatory Visit (INDEPENDENT_AMBULATORY_CARE_PROVIDER_SITE_OTHER): Payer: PPO | Admitting: Vascular Surgery

## 2020-08-25 ENCOUNTER — Other Ambulatory Visit: Payer: Self-pay

## 2020-08-25 ENCOUNTER — Encounter (INDEPENDENT_AMBULATORY_CARE_PROVIDER_SITE_OTHER): Payer: Self-pay | Admitting: Vascular Surgery

## 2020-08-25 VITALS — BP 133/77 | HR 83 | Resp 16 | Wt 131.0 lb

## 2020-08-25 DIAGNOSIS — I83893 Varicose veins of bilateral lower extremities with other complications: Secondary | ICD-10-CM

## 2020-08-25 DIAGNOSIS — I83813 Varicose veins of bilateral lower extremities with pain: Secondary | ICD-10-CM | POA: Diagnosis not present

## 2020-08-31 ENCOUNTER — Other Ambulatory Visit (INDEPENDENT_AMBULATORY_CARE_PROVIDER_SITE_OTHER): Payer: Self-pay | Admitting: Vascular Surgery

## 2020-08-31 DIAGNOSIS — I83813 Varicose veins of bilateral lower extremities with pain: Secondary | ICD-10-CM

## 2020-09-01 ENCOUNTER — Other Ambulatory Visit: Payer: Self-pay

## 2020-09-01 ENCOUNTER — Ambulatory Visit (INDEPENDENT_AMBULATORY_CARE_PROVIDER_SITE_OTHER): Payer: PPO

## 2020-09-01 DIAGNOSIS — I83813 Varicose veins of bilateral lower extremities with pain: Secondary | ICD-10-CM | POA: Diagnosis not present

## 2020-09-06 NOTE — Progress Notes (Signed)
MRN : [id number]  Kathryn Kemp is a 80 y.o. (Jan 23, 1941) female who presents with chief complaint of painful varicose veins.    The patient's left lower extremity was sterilely prepped and draped.  The ultrasound machine was used to visualize the left great saphenous vein throughout its course.  A segment left was selected for access.  The saphenous vein was accessed without difficulty using ultrasound guidance with a micropuncture needle.   An 0.018  wire was placed beyond the saphenofemoral junction through the sheath and the microneedle was removed.  The 65 cm sheath was then placed over the wire and the wire and dilator were removed.  The laser fiber was placed through the sheath and its tip was placed approximately 2 cm below the saphenofemoral junction.  Tumescent anesthesia was then created with a dilute lidocaine solution.  Laser energy was then delivered with constant withdrawal of the sheath and laser fiber.  Approximately 1403 Joules of energy were delivered over a length of 35 cm.  Sterile dressings were placed.  The patient tolerated the procedure well without complications.

## 2020-09-08 ENCOUNTER — Other Ambulatory Visit: Payer: Self-pay

## 2020-09-08 ENCOUNTER — Encounter (INDEPENDENT_AMBULATORY_CARE_PROVIDER_SITE_OTHER): Payer: Self-pay | Admitting: Vascular Surgery

## 2020-09-08 ENCOUNTER — Ambulatory Visit (INDEPENDENT_AMBULATORY_CARE_PROVIDER_SITE_OTHER): Payer: PPO | Admitting: Vascular Surgery

## 2020-09-08 VITALS — BP 151/79 | HR 77 | Resp 16 | Wt 131.4 lb

## 2020-09-08 DIAGNOSIS — I83893 Varicose veins of bilateral lower extremities with other complications: Secondary | ICD-10-CM | POA: Diagnosis not present

## 2020-09-14 ENCOUNTER — Other Ambulatory Visit (INDEPENDENT_AMBULATORY_CARE_PROVIDER_SITE_OTHER): Payer: Self-pay | Admitting: Vascular Surgery

## 2020-09-14 DIAGNOSIS — I83819 Varicose veins of unspecified lower extremities with pain: Secondary | ICD-10-CM

## 2020-09-15 ENCOUNTER — Ambulatory Visit (INDEPENDENT_AMBULATORY_CARE_PROVIDER_SITE_OTHER): Payer: PPO

## 2020-09-15 ENCOUNTER — Other Ambulatory Visit: Payer: Self-pay

## 2020-09-15 DIAGNOSIS — I83812 Varicose veins of left lower extremities with pain: Secondary | ICD-10-CM | POA: Diagnosis not present

## 2020-09-15 DIAGNOSIS — I83819 Varicose veins of unspecified lower extremities with pain: Secondary | ICD-10-CM

## 2020-09-26 DIAGNOSIS — L821 Other seborrheic keratosis: Secondary | ICD-10-CM | POA: Diagnosis not present

## 2020-09-26 DIAGNOSIS — L570 Actinic keratosis: Secondary | ICD-10-CM | POA: Diagnosis not present

## 2020-09-26 DIAGNOSIS — Z872 Personal history of diseases of the skin and subcutaneous tissue: Secondary | ICD-10-CM | POA: Diagnosis not present

## 2020-09-26 DIAGNOSIS — L720 Epidermal cyst: Secondary | ICD-10-CM | POA: Diagnosis not present

## 2020-09-26 DIAGNOSIS — Z85828 Personal history of other malignant neoplasm of skin: Secondary | ICD-10-CM | POA: Diagnosis not present

## 2020-09-26 DIAGNOSIS — L578 Other skin changes due to chronic exposure to nonionizing radiation: Secondary | ICD-10-CM | POA: Diagnosis not present

## 2020-10-13 ENCOUNTER — Ambulatory Visit (INDEPENDENT_AMBULATORY_CARE_PROVIDER_SITE_OTHER): Payer: PPO | Admitting: Vascular Surgery

## 2020-10-13 DIAGNOSIS — H353132 Nonexudative age-related macular degeneration, bilateral, intermediate dry stage: Secondary | ICD-10-CM | POA: Diagnosis not present

## 2020-10-13 DIAGNOSIS — H524 Presbyopia: Secondary | ICD-10-CM | POA: Diagnosis not present

## 2020-10-13 DIAGNOSIS — Z961 Presence of intraocular lens: Secondary | ICD-10-CM | POA: Diagnosis not present

## 2020-10-13 DIAGNOSIS — H26493 Other secondary cataract, bilateral: Secondary | ICD-10-CM | POA: Diagnosis not present

## 2020-10-13 DIAGNOSIS — H04123 Dry eye syndrome of bilateral lacrimal glands: Secondary | ICD-10-CM | POA: Diagnosis not present

## 2020-10-17 ENCOUNTER — Ambulatory Visit (INDEPENDENT_AMBULATORY_CARE_PROVIDER_SITE_OTHER): Payer: PPO | Admitting: Vascular Surgery

## 2020-10-17 ENCOUNTER — Other Ambulatory Visit: Payer: Self-pay

## 2020-10-17 VITALS — BP 149/72 | HR 82 | Resp 16 | Wt 135.0 lb

## 2020-10-17 DIAGNOSIS — I83893 Varicose veins of bilateral lower extremities with other complications: Secondary | ICD-10-CM

## 2020-10-17 DIAGNOSIS — E782 Mixed hyperlipidemia: Secondary | ICD-10-CM

## 2020-10-17 DIAGNOSIS — K2100 Gastro-esophageal reflux disease with esophagitis, without bleeding: Secondary | ICD-10-CM

## 2020-10-17 DIAGNOSIS — I10 Essential (primary) hypertension: Secondary | ICD-10-CM

## 2020-10-17 NOTE — Progress Notes (Signed)
MRN : [id number]  Kathryn Kemp is a 80 y.o. (01/05/1941) female who presents with chief complaint of No chief complaint on file. Marland Kitchen  History of Present Illness:   The patient returns to the office for followup status post laser ablation of the left great saphenous vein on 09/08/2020.  The patient note significant improvement in the lower extremity pain but not resolution of the symptoms. The patient notes multiple residual varicosities bilaterally which continued to hurt with dependent positions and remained tender to palpation. The patient's swelling is minimally from preoperative status. The patient continues to wear graduated compression stockings on a daily basis but these are not eliminating the pain and discomfort. The patient continues to use over-the-counter anti-inflammatory medications to treat the pain and related symptoms but this has not given the patient relief. The patient notes the pain in the lower extremities is causing problems with daily exercise, problems at work and even with household activities such as preparing meals and doing dishes.  The patient is otherwise done well and there have been no complications related to the laser procedure or interval changes in the patient's overall   Post laser ultrasound, dated 09/15/2020, shows successful ablation of the left great saphenous vein    No outpatient medications have been marked as taking for the 10/17/20 encounter (Appointment) with Gilda Crease, Latina Craver, MD.    Past Medical History:  Diagnosis Date   Arthritis    Cancer (HCC)    skin   Esophagitis    GERD (gastroesophageal reflux disease)    Hemorrhoid    external   Herpes zoster    Hyperlipemia    Hypertension    Increased BMI    Menopause    Osteopenia    Osteopenia    Osteoporosis    Shingles    h/o breast   SUI (stress urinary incontinence, female)    Thyroid disease     Past Surgical History:  Procedure Laterality Date   CATARACT EXTRACTION      COLONOSCOPY     COLONOSCOPY WITH PROPOFOL N/A 09/18/2017   Procedure: COLONOSCOPY WITH PROPOFOL;  Surgeon: Scot Jun, MD;  Location: [facility] ENDOSCOPY;  Service: Endoscopy;  Laterality: N/A;   TUBAL LIGATION      Social History Social History   Tobacco Use   Smoking status: Never   Smokeless tobacco: Never  Substance Use Topics   Alcohol use: No   Drug use: No    Family History Family History  Problem Relation Age of Onset   Lung cancer Mother    Heart disease Mother    Osteoporosis Mother    Prostate cancer Father    Liver cancer Sister    Breast cancer Neg Hx    Diabetes Neg Hx    Colon cancer Neg Hx    Ovarian cancer Neg Hx   No family history of bleeding/clotting disorders, porphyria or autoimmune disease   No Known Allergies   REVIEW OF SYSTEMS (Negative unless checked)  Constitutional: [] Weight loss  [] Fever  [] Chills Cardiac: [] Chest pain   [] Chest pressure   [] Palpitations   [] Shortness of breath when laying flat   [] Shortness of breath with exertion. Vascular:  [] Pain in legs with walking   [x] Pain in legs at rest  [] History of DVT   [] Phlebitis   [x] Swelling in legs   [x] Varicose veins   [] Non-healing ulcers Pulmonary:   [] Uses home oxygen   [] Productive cough   [] Hemoptysis   [] Wheeze  [] COPD   []   Asthma Neurologic:  [] Dizziness   [] Seizures   [] History of stroke   [] History of TIA  [] Aphasia   [] Vissual changes   [] Weakness or numbness in arm   [] Weakness or numbness in leg Musculoskeletal:   [] Joint swelling   [] Joint pain   [] Low back pain Hematologic:  [] Easy bruising  [] Easy bleeding   [] Hypercoagulable state   [] Anemic Gastrointestinal:  [] Diarrhea   [] Vomiting  [x] Gastroesophageal reflux/heartburn   [] Difficulty swallowing. Genitourinary:  [] Chronic kidney disease   [] Difficult urination  [] Frequent urination   [] Blood in urine Skin:  [] Rashes   [] Ulcers  Psychological:  [] History of anxiety   []  History of major depression.  Physical  Examination  There were no vitals filed for this visit. There is no height or weight on file to calculate BMI. Gen: WD/WN, NAD Head: [place]/AT, No temporalis wasting.  Ear/Nose/Throat: Hearing grossly intact, nares w/o erythema or drainage, poor dentition Eyes: PER, EOMI, sclera nonicteric.  Neck: Supple, no masses.  No bruit or JVD.  Pulmonary:  Good air movement, clear to auscultation bilaterally, no use of accessory muscles.  Cardiac: RRR, normal S1, S2, no Murmurs. Vascular:  Large residual varicosities present extensively greater than 8 mm left leg.  Mild venous stasis changes to the legs bilaterally.  1+ soft pitting edema left Vessel Right Left  Radial Palpable Palpable  PT Palpable Palpable  DP Palpable Palpable  Gastrointestinal: soft, non-distended. No guarding/no peritoneal signs.  Musculoskeletal: M/S 5/5 throughout.  No deformity or atrophy.  Neurologic: CN 2-12 intact. Pain and light touch intact in extremities.  Symmetrical.  Speech is fluent. Motor exam as listed above. Psychiatric: Judgment intact, Mood & affect appropriate for pt's clinical situation. Dermatologic: Mild venous rashes no ulcers noted.  No changes consistent with cellulitis. Lymph : No Cervical lymphadenopathy, no lichenification or skin changes of chronic lymphedema.  CBC Lab Results  Component Value Date   WBC 5.9 02/28/2017   HGB 12.8 02/28/2017   HCT 38.7 02/28/2017   MCV 78.6 (L) 02/28/2017   PLT 184 02/28/2017    BMET    Component Value Date/Time   NA 137 02/28/2017 0937   K 4.2 02/28/2017 0937   CL 105 02/28/2017 0937   CO2 24 02/28/2017 0937   GLUCOSE 106 (H) 02/28/2017 0937   BUN 14 02/28/2017 0937   CREATININE 0.73 02/28/2017 0937   CALCIUM 9.5 02/28/2017 0937   GFRNONAA >60 02/28/2017 0937   GFRAA >60 02/28/2017 0937   CrCl cannot be calculated (Patient's most recent lab result is older than the maximum 21 days allowed.).  COAG No results found for: INR,  PROTIME  Radiology No results found.   Assessment/Plan 1. Symptomatic varicose veins of both lower extremities Recommend:  The patient has had successful ablation of the previously incompetent saphenous venous system but still has persistent symptoms of pain and swelling that are having a negative impact on daily life and daily activities.  Patient should undergo injection sclerotherapy to treat the residual varicosities.  The risks, benefits and alternative therapies were reviewed in detail with the patient.  All questions were answered.  The patient agrees to proceed with sclerotherapy at their convenience.  The patient will continue wearing the graduated compression stockings and using the over-the-counter pain medications to treat her symptoms.       2. Benign essential hypertension Continue antihypertensive medications as already ordered, these medications have been reviewed and there are no changes at this time.   3. Gastroesophageal reflux disease with esophagitis  without hemorrhage Continue PPI as already ordered, this medication has been reviewed and there are no changes at this time.  Avoidence of caffeine and alcohol  Moderate elevation of the head of the bed    4. Combined hyperlipidemia Continue statin as ordered and reviewed, no changes at this time     Levora Dredge, MD  10/17/2020 9:13 AM

## 2020-10-18 ENCOUNTER — Encounter (INDEPENDENT_AMBULATORY_CARE_PROVIDER_SITE_OTHER): Payer: Self-pay | Admitting: Vascular Surgery

## 2020-11-17 NOTE — Progress Notes (Addendum)
[facility] Note  11/21/2020     CHIEF COMPLAINT Patient presents for Retina Evaluation   HISTORY OF PRESENT ILLNESS: Kathryn Kemp is a 80 y.o. female who presents to the clinic today for:   HPI     Retina Evaluation   In both eyes.  I, the attending physician,  performed the HPI with the patient and updated documentation appropriately.        Comments   Patient here for Retina Evaluation. Referred by Dr Kathlen Mody. Patient states vision is good. No eye pain. Doctor saw ARMD dry.       Last edited by Bernarda Caffey, MD on 11/21/2020  1:11 PM.    Pt states her Mother had wet macular degeneration and states she (patient) has been taking AREDS 2 vitamin for years.  Pt complains of distortion on amsler grid OD.  Pt complains of dry eye OU (OS>OD).  Referring physician: Hortencia Pilar, MD [place],  [place] [id number]  HISTORICAL INFORMATION:   Selected notes from the MEDICAL RECORD NUMBER Referred by Dr. Kathlen Mody for ARMD OU/Geographic Atrophy OD LEE: 07.07.22 Ocular Hx-ARMD, Pseudophakia PMH-    CURRENT MEDICATIONS: No current outpatient medications on file. (Ophthalmic Drugs)   No current facility-administered medications for this visit. (Ophthalmic Drugs)   Current Outpatient Medications (Other)  Medication Sig   Calcium-Vitamin D 600-200 MG-UNIT tablet Take by mouth.   hydrochlorothiazide (HYDRODIURIL) 25 MG tablet Take 25 mg by mouth daily.   Multiple Vitamins-Minerals (PRESERVISION AREDS 2 PO) Take by mouth.   omeprazole (PRILOSEC) 20 MG capsule Take by mouth.   pravastatin (PRAVACHOL) 40 MG tablet Take 40 mg by mouth daily.   No current facility-administered medications for this visit. (Other)   REVIEW OF SYSTEMS: ROS   Positive for: Eyes Last edited by Theodore Demark, COA on 11/21/2020  8:56 AM.     ALLERGIES No Known Allergies  PAST MEDICAL HISTORY Past Medical History:  Diagnosis Date    Arthritis    Cancer ([place])    skin   Esophagitis    GERD (gastroesophageal reflux disease)    Hemorrhoid    external   Herpes zoster    Hyperlipemia    Hypertension    Increased BMI    Menopause    Osteopenia    Osteopenia    Osteoporosis    Shingles    h/o breast   SUI (stress urinary incontinence, female)    Thyroid disease    Past Surgical History:  Procedure Laterality Date   CATARACT EXTRACTION     COLONOSCOPY     COLONOSCOPY WITH PROPOFOL N/A 09/18/2017   Procedure: COLONOSCOPY WITH PROPOFOL;  Surgeon: [name], MD;  Location: [facility] ENDOSCOPY;  Service: Endoscopy;  Laterality: N/A;   TUBAL LIGATION      FAMILY HISTORY Family History  Problem Relation Age of Onset   Macular degeneration Mother    Lung cancer Mother    Heart disease Mother    Osteoporosis Mother    Prostate cancer Father    Liver cancer Sister    Breast cancer Neg Hx    Diabetes Neg Hx    Colon cancer Neg Hx    Ovarian cancer Neg Hx     SOCIAL HISTORY Social History   Tobacco Use   Smoking status: Never   Smokeless tobacco: Never  Substance Use Topics   Alcohol use: No   Drug use: No  OPHTHALMIC EXAM:  Base Eye Exam     Visual Acuity (Snellen - Linear)       Right Left   Dist [place] 20/30 -1 20/25 -2   Dist ph [place] 20/25 20/20 -1         Tonometry (Tonopen, 8:53 AM)       Right Left   Pressure 15 15         Pupils       Dark Light Shape React APD   Right 4 3 Round Brisk None   Left 4 3 Round Brisk None         Visual Fields (Counting fingers)       Left Right    Full Full         Extraocular Movement       Right Left    Full, Ortho Full, Ortho         Neuro/Psych     Oriented x3: Yes   Mood/Affect: Normal         Dilation     Both eyes: 1.0% Mydriacyl, 2.5% Phenylephrine @ 8:53 AM           Slit Lamp and Fundus Exam     Slit Lamp Exam       Right Left   Lids/Lashes Dermatochalasis - upper lid, mild MGD  Dermatochalasis - upper lid, mild MGD   Conjunctiva/Sclera White and quiet White and quiet   Cornea Arcus, well healed cataract wounds, trace PEE Arcus, well healed cataract wounds, 2-3+ PEE, tear film debris   Anterior Chamber Deep and quiet Deep and quiet   Iris Round and dilated Round and dilated   Lens PCIOL in good position, 1+PCO non central PCIOL in good position, trace PCO   Vitreous Vitreous syneresis, PVD Vitreous syneresis, PVD         Fundus Exam       Right Left   Disc Pink and sharp Pink and sharp   C/D Ratio 0.3 0.3   Macula Flat, Blunted foveal reflex, drusen, RPE mottling, clumping, and early atrophy, no heme or edema, no CNV Flat, Blunted foveal reflex, drusen, RPE mottling, clumping, and early atrophy   Vessels Vascular attenuation, Tortuous, +AV crossing changes Vascular attenuation, Tortuous, +AV crossing changes   Periphery Attached, +reticular degeneration; no heme Attached, mild reticular degeneration, no heme            IMAGING AND PROCEDURES  Imaging and Procedures for 11/21/2020  OCT, Retina - OU - Both Eyes       Right Eye Quality was good. Central Foveal Thickness: 268. Progression has no prior data. Findings include normal foveal contour, outer retinal atrophy, no SRF, retinal drusen , intraretinal fluid (Central ORA with trace cystic changes nasal fovea).   Left Eye Quality was good. Central Foveal Thickness: 320. Progression has no prior data. Findings include normal foveal contour, no IRF, no SRF, subretinal hyper-reflective material, retinal drusen (Central SHRM/vitelliform like lesion).   Notes *Images captured and stored on drive  Diagnosis / Impression:  Non exudative ARMD OU OD: Central ORA with trace cystic changes nasal fovea OS: Central SHRM/vitelliform like lesion  Clinical management:  See below  Abbreviations: NFP - Normal foveal profile. CME - cystoid macular edema. PED - pigment epithelial detachment. IRF - intraretinal  fluid. SRF - subretinal fluid. EZ - ellipsoid zone. ERM - epiretinal membrane. ORA - outer retinal atrophy. ORT - outer retinal tubulation. SRHM - subretinal hyper-reflective material. IRHM - intraretinal hyper-reflective  material            ASSESSMENT/PLAN:    ICD-10-CM   1. Intermediate stage nonexudative age-related macular degeneration of both eyes  H35.3132     2. Retinal edema  H35.81 OCT, Retina - OU - Both Eyes    3. Essential hypertension  I10     4. Hypertensive retinopathy of both eyes  H35.033     5. Pseudophakia of both eyes  Z96.1     6. Dry eye  H04.129       1,2. Age related macular degeneration, non-exudative, both eyes  - +family hx of ARMD -- pt reports mother had wet AMD with significant vision loss  - pt has already been on AREDS 2 supplementation due to family history and has been monitoring her vision w/ A[place]ler grid provided by Dr. Kathlen Mody  - pt notes focal distortion superior paracentral OD - The incidence, anatomy, and pathology of dry AMD, risk of progression, and the AREDS and AREDS 2 study including smoking risks discussed with patient.            - Recommend amsler grid monitoring  - BCVA 20/25 OD, 20/20 OS  - OCT shows drusen OU; OD with focal ellipsoid loss / early GA w/ tr cystic changes            - f/u 2-3 months, DFE, OCT, FA (transit OD)  3,4. Hypertensive retinopathy OU - discussed importance of tight BP control - monitor   5. Pseudophakia OU  - s/p CE/IOL [facility])  - IOL in good position, doing well  - monitor   6. Dry Eye Syndrome OU            - Recommend Artificial tears QID OU  Ophthalmic Meds Ordered this visit:  No orders of the defined types were placed in this encounter.    This document serves as a record of services personally performed by Gardiner Sleeper, MD, PhD. It was created on their behalf by Leeann Must, [place], an ophthalmic technician. The creation of this record is the provider's dictation and/or  activities during the visit.    Electronically signed by: Leeann Must, COA '@TODAY'$ @ 1:31 PM  Gardiner Sleeper, M.D., Ph.D. Diseases & Surgery of the Retina and [place] 11/21/2020   I have reviewed the above documentation for accuracy and completeness, and I agree with the above. Gardiner Sleeper, M.D., Ph.D. 11/21/20 1:31 PM   Abbreviations: M myopia (nearsighted); A astigmatism; H hyperopia (farsighted); P presbyopia; Mrx spectacle prescription;  CTL contact lenses; OD right eye; OS left eye; OU both eyes  XT exotropia; ET esotropia; PEK punctate epithelial keratitis; PEE punctate epithelial erosions; DES dry eye syndrome; MGD meibomian gland dysfunction; ATs artificial tears; PFAT's preservative free artificial tears; [place] nuclear sclerotic cataract; PSC posterior subcapsular cataract; ERM epi-retinal membrane; PVD posterior vitreous detachment; RD retinal detachment; DM diabetes mellitus; DR diabetic retinopathy; NPDR non-proliferative diabetic retinopathy; PDR proliferative diabetic retinopathy; CSME clinically significant macular edema; DME diabetic macular edema; dbh dot blot hemorrhages; CWS cotton wool spot; POAG primary open angle glaucoma; C/D cup-to-disc ratio; HVF humphrey visual field; GVF goldmann visual field; OCT optical coherence tomography; IOP intraocular pressure; BRVO Branch retinal vein occlusion; CRVO central retinal vein occlusion; CRAO central retinal artery occlusion; BRAO branch retinal artery occlusion; RT retinal tear; SB scleral buckle; PPV pars plana vitrectomy; VH Vitreous hemorrhage; PRP panretinal laser photocoagulation; IVK intravitreal kenalog; VMT vitreomacular traction; MH Macular  hole;  NVD neovascularization of the disc; NVE neovascularization elsewhere; AREDS age related eye disease study; ARMD age related macular degeneration; POAG primary open angle glaucoma; EBMD epithelial/anterior basement membrane dystrophy; ACIOL anterior  chamber intraocular lens; IOL intraocular lens; PCIOL posterior chamber intraocular lens; Phaco/IOL phacoemulsification with intraocular lens placement; [place] photorefractive keratectomy; LASIK laser assisted in situ keratomileusis; HTN hypertension; DM diabetes mellitus; COPD chronic obstructive pulmonary disease

## 2020-11-21 ENCOUNTER — Encounter (INDEPENDENT_AMBULATORY_CARE_PROVIDER_SITE_OTHER): Payer: Self-pay | Admitting: Ophthalmology

## 2020-11-21 ENCOUNTER — Other Ambulatory Visit: Payer: Self-pay

## 2020-11-21 ENCOUNTER — Ambulatory Visit (INDEPENDENT_AMBULATORY_CARE_PROVIDER_SITE_OTHER): Payer: PPO | Admitting: Ophthalmology

## 2020-11-21 DIAGNOSIS — H353132 Nonexudative age-related macular degeneration, bilateral, intermediate dry stage: Secondary | ICD-10-CM

## 2020-11-21 DIAGNOSIS — H04129 Dry eye syndrome of unspecified lacrimal gland: Secondary | ICD-10-CM

## 2020-11-21 DIAGNOSIS — I10 Essential (primary) hypertension: Secondary | ICD-10-CM | POA: Diagnosis not present

## 2020-11-21 DIAGNOSIS — H3581 Retinal edema: Secondary | ICD-10-CM

## 2020-11-21 DIAGNOSIS — Z961 Presence of intraocular lens: Secondary | ICD-10-CM

## 2020-11-21 DIAGNOSIS — H35033 Hypertensive retinopathy, bilateral: Secondary | ICD-10-CM

## 2020-11-30 ENCOUNTER — Telehealth (INDEPENDENT_AMBULATORY_CARE_PROVIDER_SITE_OTHER): Payer: Self-pay | Admitting: Vascular Surgery

## 2020-11-30 NOTE — Telephone Encounter (Signed)
Patient called in requesting to speak with TCB regarding her insurance denial for laser procedure.  Patient left vm on TCB line.  For documenting purposes.

## 2020-12-09 ENCOUNTER — Telehealth (INDEPENDENT_AMBULATORY_CARE_PROVIDER_SITE_OTHER): Payer: Self-pay | Admitting: Vascular Surgery

## 2020-12-09 NOTE — Telephone Encounter (Signed)
Patient called inquiring about her vein ablation,she states she left a vm previously wtih TCB, (documented) but never rec'd a call back.  Documenting purposes.

## 2021-01-03 DIAGNOSIS — M5116 Intervertebral disc disorders with radiculopathy, lumbar region: Secondary | ICD-10-CM | POA: Diagnosis not present

## 2021-01-03 DIAGNOSIS — M1611 Unilateral primary osteoarthritis, right hip: Secondary | ICD-10-CM | POA: Diagnosis not present

## 2021-01-03 DIAGNOSIS — K2100 Gastro-esophageal reflux disease with esophagitis, without bleeding: Secondary | ICD-10-CM | POA: Diagnosis not present

## 2021-01-19 NOTE — Progress Notes (Signed)
[name] Note  01/24/2021     CHIEF COMPLAINT Patient presents for Retina Follow Up   HISTORY OF PRESENT ILLNESS: Kathryn Kemp is a 80 y.o. female who presents to the clinic today for:   HPI     Retina Follow Up   Patient presents with  Dry AMD.  In both eyes.  Duration of 2 months.  Since onset it is stable.  I, the attending physician,  performed the HPI with the patient and updated documentation appropriately.        Comments   2 month follow up ARMD OU- Eyes are feeling dry.  Using more eye drops than normal: Systane during the day and GenTeal qhs OU.       Last edited by Bernarda Caffey, MD on 01/25/2021  1:07 AM.    Pt states no change in vision, she is using Systane during the day and Genteal gel at night  Referring physician: Hortencia Pilar, MD [place],  [place] [id number]  HISTORICAL INFORMATION:   Selected notes from the MEDICAL RECORD NUMBER Referred by Dr. Kathlen Mody for ARMD OU/Geographic Atrophy OD LEE: 07.07.22 Ocular Hx-ARMD, Pseudophakia PMH-    CURRENT MEDICATIONS: No current outpatient medications on file. (Ophthalmic Drugs)   No current facility-administered medications for this visit. (Ophthalmic Drugs)   Current Outpatient Medications (Other)  Medication Sig   Calcium-Vitamin D 600-200 MG-UNIT tablet Take by mouth.   hydrochlorothiazide (HYDRODIURIL) 25 MG tablet Take 25 mg by mouth daily.   meloxicam (MOBIC) 7.5 MG tablet Take 7.5 mg by mouth daily.   montelukast (SINGULAIR) 10 MG tablet Take 10 mg by mouth at bedtime.   Multiple Vitamins-Minerals (PRESERVISION AREDS 2 PO) Take by mouth.   omeprazole (PRILOSEC) 20 MG capsule Take by mouth.   pravastatin (PRAVACHOL) 40 MG tablet Take 40 mg by mouth daily.   No current facility-administered medications for this visit. (Other)   REVIEW OF SYSTEMS: ROS   Positive for: Gastrointestinal, Skin, Musculoskeletal, Endocrine, Eyes Negative  for: Constitutional, Neurological, Genitourinary, HENT, Cardiovascular, Respiratory, Psychiatric, Allergic/Imm, Heme/Lymph Last edited by Leonie Douglas, COA on 01/24/2021  8:48 AM.    ALLERGIES No Known Allergies  PAST MEDICAL HISTORY Past Medical History:  Diagnosis Date   Arthritis    Cancer ([place])    skin   Esophagitis    GERD (gastroesophageal reflux disease)    Hemorrhoid    external   Herpes zoster    Hyperlipemia    Hypertension    Increased BMI    Menopause    Osteopenia    Osteopenia    Osteoporosis    Shingles    h/o breast   SUI (stress urinary incontinence, female)    Thyroid disease    Past Surgical History:  Procedure Laterality Date   CATARACT EXTRACTION     COLONOSCOPY     COLONOSCOPY WITH PROPOFOL N/A 09/18/2017   Procedure: COLONOSCOPY WITH PROPOFOL;  Surgeon: [name], MD;  Location: [facility] ENDOSCOPY;  Service: Endoscopy;  Laterality: N/A;   TUBAL LIGATION      FAMILY HISTORY Family History  Problem Relation Age of Onset   Macular degeneration Mother    Lung cancer Mother    Heart disease Mother    Osteoporosis Mother    Prostate cancer Father    Liver cancer Sister    Breast cancer Neg Hx    Diabetes Neg Hx    Colon cancer Neg Hx  Ovarian cancer Neg Hx     SOCIAL HISTORY Social History   Tobacco Use   Smoking status: Never   Smokeless tobacco: Never  Substance Use Topics   Alcohol use: No   Drug use: No       OPHTHALMIC EXAM: Base Eye Exam     Visual Acuity (Snellen - Linear)       Right Left   Dist [place] 20/25 -2 20/20 -2   Dist ph [place] NI          Tonometry (Tonopen, 8:55 AM)       Right Left   Pressure 17 20         Pupils       Dark Light Shape React APD   Right 4 3 Round Brisk None   Left 4 3 Round Brisk None         Visual Fields (Counting fingers)       Left Right    Full Full         Extraocular Movement       Right Left    Full Full         Neuro/Psych     Oriented x3: Yes    Mood/Affect: Normal         Dilation     Both eyes: 1.0% Mydriacyl, 2.5% Phenylephrine @ 8:55 AM           Slit Lamp and Fundus Exam     Slit Lamp Exam       Right Left   Lids/Lashes Dermatochalasis - upper lid, mild MGD Dermatochalasis - upper lid, mild MGD   Conjunctiva/Sclera White and quiet White and quiet   Cornea Arcus, well healed cataract wounds, trace PEE Arcus, well healed cataract wounds, 2+ PEE, tear film debris   Anterior Chamber Deep and quiet Deep and quiet   Iris Round and dilated Round and dilated   Lens PCIOL in good position, 1+PCO non central PCIOL in good position, trace PCO   Vitreous Vitreous syneresis, PVD Vitreous syneresis, PVD         Fundus Exam       Right Left   Disc Pink and sharp Pink and sharp   C/D Ratio 0.3 0.3   Macula Flat, Blunted foveal reflex, drusen, RPE mottling, clumping, and early atrophy, no heme or edema, no CNV Flat, Blunted foveal reflex, drusen, RPE mottling, clumping, and early atrophy, prominent focal pigment clump temporal mac, +small PED / vitelliform like lesion inferior to fovea   Vessels mild attenuation, mild tortuousity mild attenuation, mild tortuousity, mild AV crossing changes   Periphery Attached, +reticular degeneration; no heme Attached, mild reticular degeneration, no heme            IMAGING AND PROCEDURES  Imaging and Procedures for 01/24/2021  OCT, Retina - OU - Both Eyes       Right Eye Quality was good. Central Foveal Thickness: 262. Progression has been stable. Findings include normal foveal contour, outer retinal atrophy, no SRF, retinal drusen , no IRF (Central ORA with trace cystic changes nasal fovea).   Left Eye Quality was good. Central Foveal Thickness: 326. Progression has been stable. Findings include normal foveal contour, no IRF, no SRF, subretinal hyper-reflective material, retinal drusen (Central SHRM/vitelliform like lesion -- stable).   Notes *Images captured and stored on  drive  Diagnosis / Impression:  Non exudative ARMD OU OD: Central ORA with trace cystic changes nasal fovea OS: Central SHRM/vitelliform like lesion --  stable  Clinical management:  See below  Abbreviations: NFP - Normal foveal profile. CME - cystoid macular edema. PED - pigment epithelial detachment. IRF - intraretinal fluid. SRF - subretinal fluid. EZ - ellipsoid zone. ERM - epiretinal membrane. ORA - outer retinal atrophy. ORT - outer retinal tubulation. SRHM - subretinal hyper-reflective material. IRHM - intraretinal hyper-reflective material      Fluorescein Angiography Optos (Transit OD)       Right Eye Progression has no prior data. Early phase findings include staining. Mid/Late phase findings include staining (No leakage or CNV).   Left Eye Progression has no prior data. Early phase findings include staining. Mid/Late phase findings include staining (No leakage or CNV, mild focal staining inferior macula corresponding to vitelliform like lesion).   Notes **Images stored on drive**  Impression: No leakage or CNV OU OS: mild focal staining inferior macula corresponding to vitelliform-like lesion             ASSESSMENT/PLAN:    ICD-10-CM   1. Intermediate stage nonexudative age-related macular degeneration of both eyes  H35.3132     2. Retinal edema  H35.81 OCT, Retina - OU - Both Eyes    3. Essential hypertension  I10     4. Hypertensive retinopathy of both eyes  H35.033 Fluorescein Angiography Optos (Transit OD)    5. Pseudophakia of both eyes  Z96.1     6. Dry eye  H04.129        1,2. Age related macular degeneration, non-exudative, both eyes  - +family hx of ARMD -- pt reports mother had wet AMD with significant vision loss  - pt has already been on AREDS 2 supplementation due to family history and has been monitoring her vision w/ Amsler grid provided by Dr. Kathlen Mody  - pt notes focal distortion superior paracentral OD             - Recommend  amsler grid monitoring  - BCVA 20/25 OD, 20/20 OS  - OCT shows drusen OU; OD with focal ellipsoid loss / early GA w/ tr cystic changes  - FA 10.18.22 -- no leakage or CNV OU; mild staining of focal vitelliform like lesion OS -- inferior to fovea            - f/u 3-4 months, DFE, OCT  3,4. Hypertensive retinopathy OU - discussed importance of tight BP control - monitor   5. Pseudophakia OU  - s/p CE/IOL [facility])  - IOL in good position, doing well  - monitor   6. Dry Eye Syndrome OU            - Recommend Artificial tears QID OU  Ophthalmic Meds Ordered this visit:  No orders of the defined types were placed in this encounter.    This document serves as a record of services personally performed by Gardiner Sleeper, MD, PhD. It was created on their behalf by Estill Bakes, COT an ophthalmic technician. The creation of this record is the provider's dictation and/or activities during the visit.    Electronically signed by: Estill Bakes, COT 10.13.22 @ 1:10 AM   This document serves as a record of services personally performed by Gardiner Sleeper, MD, PhD. It was created on their behalf by San Jetty. Owens Shark, OA an ophthalmic technician. The creation of this record is the provider's dictation and/or activities during the visit.    Electronically signed by: San Jetty. [place], OA 10.18.2022 1:10 AM   Gardiner Sleeper, M.D., Ph.D. Diseases &  Surgery of the Retina and Vitreous [facility]  I have reviewed the above documentation for accuracy and completeness, and I agree with the above. Gardiner Sleeper, M.D., Ph.D. 01/25/21 1:10 AM  Abbreviations: M myopia (nearsighted); A astigmatism; H hyperopia (farsighted); P presbyopia; Mrx spectacle prescription;  CTL contact lenses; OD right eye; OS left eye; OU both eyes  XT exotropia; ET esotropia; PEK punctate epithelial keratitis; PEE punctate epithelial erosions; DES dry eye syndrome; MGD meibomian gland dysfunction;  ATs artificial tears; PFAT's preservative free artificial tears; [place] nuclear sclerotic cataract; PSC posterior subcapsular cataract; ERM epi-retinal membrane; PVD posterior vitreous detachment; RD retinal detachment; DM diabetes mellitus; DR diabetic retinopathy; NPDR non-proliferative diabetic retinopathy; PDR proliferative diabetic retinopathy; CSME clinically significant macular edema; DME diabetic macular edema; dbh dot blot hemorrhages; CWS cotton wool spot; POAG primary open angle glaucoma; C/D cup-to-disc ratio; HVF humphrey visual field; GVF goldmann visual field; OCT optical coherence tomography; IOP intraocular pressure; BRVO Branch retinal vein occlusion; CRVO central retinal vein occlusion; CRAO central retinal artery occlusion; BRAO branch retinal artery occlusion; RT retinal tear; SB scleral buckle; PPV pars plana vitrectomy; VH Vitreous hemorrhage; PRP panretinal laser photocoagulation; IVK intravitreal kenalog; VMT vitreomacular traction; MH Macular hole;  NVD neovascularization of the disc; NVE neovascularization elsewhere; AREDS age related eye disease study; ARMD age related macular degeneration; POAG primary open angle glaucoma; EBMD epithelial/anterior basement membrane dystrophy; ACIOL anterior chamber intraocular lens; IOL intraocular lens; PCIOL posterior chamber intraocular lens; Phaco/IOL phacoemulsification with intraocular lens placement; [place] photorefractive keratectomy; LASIK laser assisted in situ keratomileusis; HTN hypertension; DM diabetes mellitus; COPD chronic obstructive pulmonary disease

## 2021-01-23 ENCOUNTER — Encounter (INDEPENDENT_AMBULATORY_CARE_PROVIDER_SITE_OTHER): Payer: PPO | Admitting: Ophthalmology

## 2021-01-24 ENCOUNTER — Other Ambulatory Visit: Payer: Self-pay

## 2021-01-24 ENCOUNTER — Ambulatory Visit (INDEPENDENT_AMBULATORY_CARE_PROVIDER_SITE_OTHER): Payer: PPO | Admitting: Ophthalmology

## 2021-01-24 DIAGNOSIS — I10 Essential (primary) hypertension: Secondary | ICD-10-CM | POA: Diagnosis not present

## 2021-01-24 DIAGNOSIS — H35033 Hypertensive retinopathy, bilateral: Secondary | ICD-10-CM | POA: Diagnosis not present

## 2021-01-24 DIAGNOSIS — Z961 Presence of intraocular lens: Secondary | ICD-10-CM | POA: Diagnosis not present

## 2021-01-24 DIAGNOSIS — H04129 Dry eye syndrome of unspecified lacrimal gland: Secondary | ICD-10-CM | POA: Diagnosis not present

## 2021-01-24 DIAGNOSIS — H353132 Nonexudative age-related macular degeneration, bilateral, intermediate dry stage: Secondary | ICD-10-CM

## 2021-01-24 DIAGNOSIS — H3581 Retinal edema: Secondary | ICD-10-CM

## 2021-01-25 ENCOUNTER — Encounter (INDEPENDENT_AMBULATORY_CARE_PROVIDER_SITE_OTHER): Payer: Self-pay | Admitting: Ophthalmology

## 2021-04-05 ENCOUNTER — Other Ambulatory Visit: Payer: Self-pay | Admitting: Internal Medicine

## 2021-04-05 DIAGNOSIS — Z1231 Encounter for screening mammogram for malignant neoplasm of breast: Secondary | ICD-10-CM

## 2021-05-03 ENCOUNTER — Other Ambulatory Visit: Payer: Self-pay

## 2021-05-03 ENCOUNTER — Ambulatory Visit
Admission: RE | Admit: 2021-05-03 | Discharge: 2021-05-03 | Disposition: A | Discharge status: Home / Self Care | Payer: PPO | Source: Ambulatory Visit | Attending: Internal Medicine | Admitting: Internal Medicine

## 2021-05-03 DIAGNOSIS — Z1231 Encounter for screening mammogram for malignant neoplasm of breast: Secondary | ICD-10-CM | POA: Insufficient documentation

## 2021-05-08 DIAGNOSIS — M47816 Spondylosis without myelopathy or radiculopathy, lumbar region: Secondary | ICD-10-CM | POA: Diagnosis not present

## 2021-05-08 DIAGNOSIS — M25551 Pain in right hip: Secondary | ICD-10-CM | POA: Diagnosis not present

## 2021-05-18 DIAGNOSIS — M5441 Lumbago with sciatica, right side: Secondary | ICD-10-CM | POA: Diagnosis not present

## 2021-05-18 DIAGNOSIS — G8929 Other chronic pain: Secondary | ICD-10-CM | POA: Diagnosis not present

## 2021-05-23 NOTE — Progress Notes (Shared)
[facility] Note  05/30/2021     CHIEF COMPLAINT Patient presents for No chief complaint on file.    HISTORY OF PRESENT ILLNESS: Kathryn Kemp is a 81 y.o. female who presents to the clinic today for:    Pt states no change in vision, she is using Systane during the day and Genteal gel at night  Referring physician: Rusty Aus, MD [place],   [id number]  HISTORICAL INFORMATION:   Selected notes from the MEDICAL RECORD NUMBER Referred by Dr. Kathlen Mody for ARMD OU/Geographic Atrophy OD LEE: 07.07.22 Ocular Hx-ARMD, Pseudophakia PMH-    CURRENT MEDICATIONS: No current outpatient medications on file. (Ophthalmic Drugs)   No current facility-administered medications for this visit. (Ophthalmic Drugs)   Current Outpatient Medications (Other)  Medication Sig   Calcium-Vitamin D 600-200 MG-UNIT tablet Take by mouth.   hydrochlorothiazide (HYDRODIURIL) 25 MG tablet Take 25 mg by mouth daily.   meloxicam (MOBIC) 7.5 MG tablet Take 7.5 mg by mouth daily.   montelukast (SINGULAIR) 10 MG tablet Take 10 mg by mouth at bedtime.   Multiple Vitamins-Minerals (PRESERVISION AREDS 2 PO) Take by mouth.   omeprazole (PRILOSEC) 20 MG capsule Take by mouth.   pravastatin (PRAVACHOL) 40 MG tablet Take 40 mg by mouth daily.   No current facility-administered medications for this visit. (Other)   REVIEW OF SYSTEMS:  ALLERGIES No Known Allergies  PAST MEDICAL HISTORY Past Medical History:  Diagnosis Date   Arthritis    Cancer ([place])    skin   Esophagitis    GERD (gastroesophageal reflux disease)    Hemorrhoid    external   Herpes zoster    Hyperlipemia    Hypertension    Increased BMI    Menopause    Osteopenia    Osteopenia    Osteoporosis    Shingles    h/o breast   SUI (stress urinary incontinence, female)    Thyroid disease    Past Surgical History:  Procedure Laterality Date    CATARACT EXTRACTION     COLONOSCOPY     COLONOSCOPY WITH PROPOFOL N/A 09/18/2017   Procedure: COLONOSCOPY WITH PROPOFOL;  Surgeon: [name], MD;  Location: [facility] ENDOSCOPY;  Service: Endoscopy;  Laterality: N/A;   TUBAL LIGATION      FAMILY HISTORY Family History  Problem Relation Age of Onset   Macular degeneration Mother    Lung cancer Mother    Heart disease Mother    Osteoporosis Mother    Prostate cancer Father    Liver cancer Sister    Breast cancer Neg Hx    Diabetes Neg Hx    Colon cancer Neg Hx    Ovarian cancer Neg Hx     SOCIAL HISTORY Social History   Tobacco Use   Smoking status: Never   Smokeless tobacco: Never  Substance Use Topics   Alcohol use: No   Drug use: No       OPHTHALMIC EXAM: Not recorded     IMAGING AND PROCEDURES  Imaging and Procedures for 05/30/2021           ASSESSMENT/PLAN:    ICD-10-CM   1. Intermediate stage nonexudative age-related macular degeneration of both eyes  H35.3132     2. Essential hypertension  I10     3. Hypertensive retinopathy of both eyes  H35.033     4. Pseudophakia of both eyes  Z96.1  5. Dry eye  H04.129        1,2. Age related macular degeneration, non-exudative, both eyes  - +family hx of ARMD -- pt reports mother had wet AMD with significant vision loss  - pt has already been on AREDS 2 supplementation due to family history and has been monitoring her vision w/ Amsler grid provided by Dr. Kathlen Mody  - pt notes focal distortion superior paracentral OD             - Recommend amsler grid monitoring  - BCVA 20/25 OD, 20/20 OS  - OCT shows drusen OU; OD with focal ellipsoid loss / early GA w/ tr cystic changes  - FA 10.18.22 -- no leakage or CNV OU; mild staining of focal vitelliform like lesion OS -- inferior to fovea            - f/u 3-4 months, DFE, OCT  3,4. Hypertensive retinopathy OU - discussed importance of tight BP control - monitor   5. Pseudophakia OU  - s/p CE/IOL [facility])  - IOL in good position, doing well  - monitor   6. Dry Eye Syndrome OU            - Recommend Artificial tears QID OU  Ophthalmic Meds Ordered this visit:  No orders of the defined types were placed in this encounter.     This document serves as a record of services personally performed by Gardiner Sleeper, MD, PhD. It was created on their behalf by San Jetty. Owens Shark, OA an ophthalmic technician. The creation of this record is the provider's dictation and/or activities during the visit.    Electronically signed by: San Jetty. Owens Shark, [place] 02.14.2023 8:02 AM    Gardiner Sleeper, M.D., Ph.D. Diseases & Surgery of the Retina and Vitreous [facility]    Abbreviations: M myopia (nearsighted); A astigmatism; H hyperopia (farsighted); P presbyopia; Mrx spectacle prescription;  CTL contact lenses; OD right eye; OS left eye; OU both eyes  XT exotropia; ET esotropia; PEK punctate epithelial keratitis; PEE punctate epithelial erosions; DES dry eye syndrome; MGD meibomian gland dysfunction; ATs artificial tears; PFAT's preservative free artificial tears; [place] nuclear sclerotic cataract; PSC posterior subcapsular cataract; ERM epi-retinal membrane; PVD posterior vitreous detachment; RD retinal detachment; DM diabetes mellitus; DR diabetic retinopathy; NPDR non-proliferative diabetic retinopathy; PDR proliferative diabetic retinopathy; CSME clinically significant macular edema; DME diabetic macular edema; dbh dot blot hemorrhages; CWS cotton wool spot; POAG primary open angle glaucoma; C/D cup-to-disc ratio; HVF humphrey visual field; GVF goldmann visual field; OCT optical coherence tomography; IOP intraocular pressure; BRVO Branch retinal vein occlusion; CRVO central retinal vein occlusion; CRAO central retinal artery occlusion; BRAO branch retinal artery occlusion; RT retinal tear; SB scleral buckle; PPV pars plana vitrectomy; VH Vitreous hemorrhage; PRP panretinal laser  photocoagulation; IVK intravitreal kenalog; VMT vitreomacular traction; MH Macular hole;  NVD neovascularization of the disc; NVE neovascularization elsewhere; AREDS age related eye disease study; ARMD age related macular degeneration; POAG primary open angle glaucoma; EBMD epithelial/anterior basement membrane dystrophy; ACIOL anterior chamber intraocular lens; IOL intraocular lens; PCIOL posterior chamber intraocular lens; Phaco/IOL phacoemulsification with intraocular lens placement; [place] photorefractive keratectomy; LASIK laser assisted in situ keratomileusis; HTN hypertension; DM diabetes mellitus; COPD chronic obstructive pulmonary disease

## 2021-05-25 DIAGNOSIS — G8929 Other chronic pain: Secondary | ICD-10-CM | POA: Diagnosis not present

## 2021-05-25 DIAGNOSIS — M5441 Lumbago with sciatica, right side: Secondary | ICD-10-CM | POA: Diagnosis not present

## 2021-05-29 NOTE — Progress Notes (Signed)
[facility] Note  06/02/2021     CHIEF COMPLAINT Patient presents for Retina Follow Up    HISTORY OF PRESENT ILLNESS: Kathryn Kemp is a 81 y.o. female who presents to the clinic today for:   HPI     Retina Follow Up   Patient presents with  Dry AMD.  In both eyes.  Duration of 4 months.  Since onset it is stable.  I, the attending physician,  performed the HPI with the patient and updated documentation appropriately.        Comments   4 month follow up ARMD OU- Doing well, no changes since last visit.       Last edited by Bernarda Caffey, MD on 06/04/2021 11:15 PM.     Pt states she has not noticed a lot of change in vision since she was here last, but the right eye has more distortion than the left  Referring physician: Rusty Aus, MD [place],   [id number]  HISTORICAL INFORMATION:   Selected notes from the MEDICAL RECORD NUMBER Referred by Dr. Kathlen Mody for ARMD OU/Geographic Atrophy OD LEE: 07.07.22 Ocular Hx-ARMD, Pseudophakia PMH-    CURRENT MEDICATIONS: No current outpatient medications on file. (Ophthalmic Drugs)   No current facility-administered medications for this visit. (Ophthalmic Drugs)   Current Outpatient Medications (Other)  Medication Sig   Calcium-Vitamin D 600-200 MG-UNIT tablet Take by mouth.   hydrochlorothiazide (HYDRODIURIL) 25 MG tablet Take 25 mg by mouth daily.   meloxicam (MOBIC) 7.5 MG tablet Take 7.5 mg by mouth daily.   montelukast (SINGULAIR) 10 MG tablet Take 10 mg by mouth at bedtime.   Multiple Vitamins-Minerals (PRESERVISION AREDS 2 PO) Take by mouth.   omeprazole (PRILOSEC) 20 MG capsule Take by mouth.   pravastatin (PRAVACHOL) 40 MG tablet Take 40 mg by mouth daily.   No current facility-administered medications for this visit. (Other)   REVIEW OF SYSTEMS: ROS   Positive for: Gastrointestinal, Skin, Musculoskeletal, Endocrine,  Eyes Negative for: Constitutional, Neurological, Genitourinary, HENT, Cardiovascular, Respiratory, Psychiatric, Allergic/Imm, Heme/Lymph Last edited by Leonie Douglas, COA on 06/02/2021  1:03 PM.     ALLERGIES No Known Allergies  PAST MEDICAL HISTORY Past Medical History:  Diagnosis Date   Arthritis    Cancer ([place])    skin   Esophagitis    GERD (gastroesophageal reflux disease)    Hemorrhoid    external   Herpes zoster    Hyperlipemia    Hypertension    Increased BMI    Menopause    Osteopenia    Osteopenia    Osteoporosis    Shingles    h/o breast   SUI (stress urinary incontinence, female)    Thyroid disease    Past Surgical History:  Procedure Laterality Date   CATARACT EXTRACTION     COLONOSCOPY     COLONOSCOPY WITH PROPOFOL N/A 09/18/2017   Procedure: COLONOSCOPY WITH PROPOFOL;  Surgeon: [name], MD;  Location[facility] ENDOSCOPY;  Service: Endoscopy;  Laterality: N/A;   TUBAL LIGATION     FAMILY HISTORY Family History  Problem Relation Age of Onset   Macular degeneration Mother    Lung cancer Mother    Heart disease Mother    Osteoporosis Mother    Prostate cancer Father    Liver cancer Sister    Breast cancer Neg Hx    Diabetes Neg Hx    Colon cancer Neg Hx  Ovarian cancer Neg Hx    SOCIAL HISTORY Social History   Tobacco Use   Smoking status: Never   Smokeless tobacco: Never  Substance Use Topics   Alcohol use: No   Drug use: No       OPHTHALMIC EXAM: Base Eye Exam     Visual Acuity (Snellen - Linear)       Right Left   Dist [place] 20/30 -2 20/25 +1   Dist ph [place] NI NI         Tonometry (Tonopen, 1:14 PM)       Right Left   Pressure 14 14         Pupils       Dark Light Shape React APD   Right 4 3 Round Brisk None   Left 4 3 Round Brisk None         Visual Fields (Counting fingers)       Left Right    Full Full         Extraocular Movement       Right Left    Full Full         Neuro/Psych      Oriented x3: Yes   Mood/Affect: Normal         Dilation     Both eyes: 1.0% Mydriacyl, 2.5% Phenylephrine @ 1:14 PM           Slit Lamp and Fundus Exam     Slit Lamp Exam       Right Left   Lids/Lashes Dermatochalasis - upper lid, mild MGD Dermatochalasis - upper lid, mild MGD   Conjunctiva/Sclera White and quiet White and quiet   Cornea Arcus, well healed cataract wounds, trace PEE Arcus, well healed cataract wounds, 1+ PEE, tear film debris, trace fine endo pigment   Anterior Chamber Deep and quiet Deep and quiet   Iris Round and dilated Round and dilated   Lens PCIOL in good position, 1+PCO non central PCIOL in good position, trace PCO   Anterior Vitreous Vitreous syneresis, PVD Vitreous syneresis, PVD         Fundus Exam       Right Left   Disc Pink and sharp Pink and sharp   C/D Ratio 0.3 0.3   Macula Flat, Blunted foveal reflex, drusen, RPE mottling, clumping, and early atrophy, no heme or edema, no CNV Flat, Blunted foveal reflex, drusen, RPE mottling, clumping, and early atrophy, prominent focal pigment clump temporal mac, +small PED / vitelliform like lesion inferior to fovea   Vessels mild attenuation, mild tortuousity Tortuous   Periphery Attached, +reticular degeneration; no heme Attached, mild reticular degeneration, no heme           Refraction     Manifest Refraction       Sphere Cylinder Axis Dist VA   Right -0.25 +0.50 150 20/25-2   Left +0.50 Sphere  20/20-2            IMAGING AND PROCEDURES  Imaging and Procedures for 06/02/2021  OCT, Retina - OU - Both Eyes       Right Eye Quality was good. Central Foveal Thickness: 262. Progression has been stable. Findings include normal foveal contour, outer retinal atrophy, no SRF, retinal drusen , no IRF (Central ORA with trace cystic changes nasal fovea - improved - ?mild progression of ORA).   Left Eye Quality was good. Central Foveal Thickness: 325. Progression has been stable. Findings  include normal foveal contour, no IRF,  no SRF, subretinal hyper-reflective material, retinal drusen , pigment epithelial detachment (Central SHRM/vitelliform like lesion -- stable).   Notes *Images captured and stored on drive  Diagnosis / Impression:  Non exudative ARMD OU OD: Central ORA with trace cystic changes nasal fovea -- improved, ?mild progression of ORA OS: Central SHRM/vitelliform like lesion -- stable  Clinical management:  See below  Abbreviations: NFP - Normal foveal profile. CME - cystoid macular edema. PED - pigment epithelial detachment. IRF - intraretinal fluid. SRF - subretinal fluid. EZ - ellipsoid zone. ERM - epiretinal membrane. ORA - outer retinal atrophy. ORT - outer retinal tubulation. SRHM - subretinal hyper-reflective material. IRHM - intraretinal hyper-reflective material              ASSESSMENT/PLAN:    ICD-10-CM   1. Intermediate stage nonexudative age-related macular degeneration of both eyes  H35.3132 OCT, Retina - OU - Both Eyes    2. Essential hypertension  I10     3. Hypertensive retinopathy of both eyes  H35.033     4. Pseudophakia of both eyes  Z96.1     5. Dry eye  H04.129      1. Age related macular degeneration, non-exudative, both eyes  - +family hx of ARMD -- pt reports mother had wet AMD with significant vision loss  - pt has already been on AREDS 2 supplementation due to family history and has been monitoring her vision w/ Amsler grid provided by Dr. Kathlen Mody  - pt notes focal distortion superior paracentral OD             - Recommend amsler grid monitoring  - BCVA 20/30 OD - decreased, 20/20 OS - stable  - OCT shows drusen OU; OD: Central ORA with trace cystic changes nasal fovea -- improved, ?mild progression of ORA; OS: Central SHRM/vitelliform like lesion -- stable  - FA 10.18.22 -- no leakage or CNV OU; mild staining of focal vitelliform like lesion OS -- inferior to fovea             - f/u 4 months, DFE, OCT  2,3.  Hypertensive retinopathy OU - discussed importance of tight BP control - monitor   4. Pseudophakia OU  - s/p CE/IOL [facility])  - IOL in good position, doing well  - monitor   5. Dry Eye Syndrome OU            - Recommend Artificial tears QID OU  Ophthalmic Meds Ordered this visit:  No orders of the defined types were placed in this encounter.     This document serves as a record of services personally performed by Gardiner Sleeper, MD, PhD. It was created on their behalf by Leonie Douglas, an ophthalmic technician. The creation of this record is the provider's dictation and/or activities during the visit.    Electronically signed by: Leonie Douglas COA, 06/04/21  11:20 PM  This document serves as a record of services personally performed by Gardiner Sleeper, MD, PhD. It was created on their behalf by San Jetty. Owens Shark, OA an ophthalmic technician. The creation of this record is the provider's dictation and/or activities during the visit.    Electronically signed by: San Jetty. Owens Shark, [place] 02.24.2023 11:20 PM  Gardiner Sleeper, M.D., Ph.D. Diseases & Surgery of the Retina and Vitreous [facility]  I have reviewed the above documentation for accuracy and completeness, and I agree with the above. Gardiner Sleeper, M.D., Ph.D. 06/04/21 11:24 PM  Abbreviations: M myopia (nearsighted); A astigmatism; H hyperopia (farsighted); P presbyopia; Mrx spectacle prescription;  CTL contact lenses; OD right eye; OS left eye; OU both eyes  XT exotropia; ET esotropia; PEK punctate epithelial keratitis; PEE punctate epithelial erosions; DES dry eye syndrome; MGD meibomian gland dysfunction; ATs artificial tears; PFAT's preservative free artificial tears; [place] nuclear sclerotic cataract; PSC posterior subcapsular cataract; ERM epi-retinal membrane; PVD posterior vitreous detachment; RD retinal detachment; DM diabetes mellitus; DR diabetic retinopathy; NPDR non-proliferative diabetic  retinopathy; PDR proliferative diabetic retinopathy; CSME clinically significant macular edema; DME diabetic macular edema; dbh dot blot hemorrhages; CWS cotton wool spot; POAG primary open angle glaucoma; C/D cup-to-disc ratio; HVF humphrey visual field; GVF goldmann visual field; OCT optical coherence tomography; IOP intraocular pressure; BRVO Branch retinal vein occlusion; CRVO central retinal vein occlusion; CRAO central retinal artery occlusion; BRAO branch retinal artery occlusion; RT retinal tear; SB scleral buckle; PPV pars plana vitrectomy; VH Vitreous hemorrhage; PRP panretinal laser photocoagulation; IVK intravitreal kenalog; VMT vitreomacular traction; MH Macular hole;  NVD neovascularization of the disc; NVE neovascularization elsewhere; AREDS age related eye disease study; ARMD age related macular degeneration; POAG primary open angle glaucoma; EBMD epithelial/anterior basement membrane dystrophy; ACIOL anterior chamber intraocular lens; IOL intraocular lens; PCIOL posterior chamber intraocular lens; Phaco/IOL phacoemulsification with intraocular lens placement; [place] photorefractive keratectomy; LASIK laser assisted in situ keratomileusis; HTN hypertension; DM diabetes mellitus; COPD chronic obstructive pulmonary disease

## 2021-05-30 ENCOUNTER — Encounter (INDEPENDENT_AMBULATORY_CARE_PROVIDER_SITE_OTHER): Payer: PPO | Admitting: Ophthalmology

## 2021-05-30 DIAGNOSIS — H353132 Nonexudative age-related macular degeneration, bilateral, intermediate dry stage: Secondary | ICD-10-CM

## 2021-05-30 DIAGNOSIS — H35033 Hypertensive retinopathy, bilateral: Secondary | ICD-10-CM

## 2021-05-30 DIAGNOSIS — I10 Essential (primary) hypertension: Secondary | ICD-10-CM

## 2021-05-30 DIAGNOSIS — H04129 Dry eye syndrome of unspecified lacrimal gland: Secondary | ICD-10-CM

## 2021-05-30 DIAGNOSIS — Z961 Presence of intraocular lens: Secondary | ICD-10-CM

## 2021-06-01 DIAGNOSIS — G8929 Other chronic pain: Secondary | ICD-10-CM | POA: Diagnosis not present

## 2021-06-01 DIAGNOSIS — M5441 Lumbago with sciatica, right side: Secondary | ICD-10-CM | POA: Diagnosis not present

## 2021-06-02 ENCOUNTER — Encounter (INDEPENDENT_AMBULATORY_CARE_PROVIDER_SITE_OTHER): Payer: Self-pay | Admitting: Ophthalmology

## 2021-06-02 ENCOUNTER — Other Ambulatory Visit: Payer: Self-pay

## 2021-06-02 ENCOUNTER — Ambulatory Visit (INDEPENDENT_AMBULATORY_CARE_PROVIDER_SITE_OTHER): Payer: PPO | Admitting: Ophthalmology

## 2021-06-02 DIAGNOSIS — H353132 Nonexudative age-related macular degeneration, bilateral, intermediate dry stage: Secondary | ICD-10-CM | POA: Diagnosis not present

## 2021-06-02 DIAGNOSIS — H35033 Hypertensive retinopathy, bilateral: Secondary | ICD-10-CM | POA: Diagnosis not present

## 2021-06-02 DIAGNOSIS — Z961 Presence of intraocular lens: Secondary | ICD-10-CM | POA: Diagnosis not present

## 2021-06-02 DIAGNOSIS — H04129 Dry eye syndrome of unspecified lacrimal gland: Secondary | ICD-10-CM | POA: Diagnosis not present

## 2021-06-02 DIAGNOSIS — I10 Essential (primary) hypertension: Secondary | ICD-10-CM | POA: Diagnosis not present

## 2021-06-04 ENCOUNTER — Encounter (INDEPENDENT_AMBULATORY_CARE_PROVIDER_SITE_OTHER): Payer: Self-pay | Admitting: Ophthalmology

## 2021-06-15 DIAGNOSIS — M5441 Lumbago with sciatica, right side: Secondary | ICD-10-CM | POA: Diagnosis not present

## 2021-06-15 DIAGNOSIS — G8929 Other chronic pain: Secondary | ICD-10-CM | POA: Diagnosis not present

## 2021-06-22 DIAGNOSIS — M5441 Lumbago with sciatica, right side: Secondary | ICD-10-CM | POA: Diagnosis not present

## 2021-06-22 DIAGNOSIS — G8929 Other chronic pain: Secondary | ICD-10-CM | POA: Diagnosis not present

## 2021-07-07 ENCOUNTER — Other Ambulatory Visit: Payer: Self-pay | Admitting: Surgery

## 2021-07-07 DIAGNOSIS — M47816 Spondylosis without myelopathy or radiculopathy, lumbar region: Secondary | ICD-10-CM | POA: Diagnosis not present

## 2021-07-24 ENCOUNTER — Ambulatory Visit
Admission: RE | Admit: 2021-07-24 | Discharge: 2021-07-24 | Disposition: A | Discharge status: Home / Self Care | Payer: PPO | Source: Ambulatory Visit | Attending: Surgery | Admitting: Surgery

## 2021-07-24 DIAGNOSIS — M47816 Spondylosis without myelopathy or radiculopathy, lumbar region: Secondary | ICD-10-CM

## 2021-07-24 DIAGNOSIS — M4726 Other spondylosis with radiculopathy, lumbar region: Secondary | ICD-10-CM | POA: Diagnosis not present

## 2021-07-24 DIAGNOSIS — M5116 Intervertebral disc disorders with radiculopathy, lumbar region: Secondary | ICD-10-CM | POA: Diagnosis not present

## 2021-08-02 DIAGNOSIS — M5126 Other intervertebral disc displacement, lumbar region: Secondary | ICD-10-CM | POA: Diagnosis not present

## 2021-08-02 DIAGNOSIS — M5416 Radiculopathy, lumbar region: Secondary | ICD-10-CM | POA: Diagnosis not present

## 2021-08-18 DIAGNOSIS — E782 Mixed hyperlipidemia: Secondary | ICD-10-CM | POA: Diagnosis not present

## 2021-08-25 DIAGNOSIS — R0600 Dyspnea, unspecified: Secondary | ICD-10-CM | POA: Diagnosis not present

## 2021-08-25 DIAGNOSIS — R0602 Shortness of breath: Secondary | ICD-10-CM | POA: Diagnosis not present

## 2021-08-25 DIAGNOSIS — Z0000 Encounter for general adult medical examination without abnormal findings: Secondary | ICD-10-CM | POA: Diagnosis not present

## 2021-08-25 DIAGNOSIS — M47816 Spondylosis without myelopathy or radiculopathy, lumbar region: Secondary | ICD-10-CM | POA: Diagnosis not present

## 2021-08-25 DIAGNOSIS — E782 Mixed hyperlipidemia: Secondary | ICD-10-CM | POA: Diagnosis not present

## 2021-08-25 DIAGNOSIS — R739 Hyperglycemia, unspecified: Secondary | ICD-10-CM | POA: Diagnosis not present

## 2021-08-28 DIAGNOSIS — M65331 Trigger finger, right middle finger: Secondary | ICD-10-CM | POA: Diagnosis not present

## 2021-09-06 DIAGNOSIS — M5126 Other intervertebral disc displacement, lumbar region: Secondary | ICD-10-CM | POA: Diagnosis not present

## 2021-09-06 DIAGNOSIS — M48062 Spinal stenosis, lumbar region with neurogenic claudication: Secondary | ICD-10-CM | POA: Diagnosis not present

## 2021-09-06 DIAGNOSIS — M5416 Radiculopathy, lumbar region: Secondary | ICD-10-CM | POA: Diagnosis not present

## 2021-09-21 DIAGNOSIS — M5416 Radiculopathy, lumbar region: Secondary | ICD-10-CM | POA: Diagnosis not present

## 2021-09-21 DIAGNOSIS — M5126 Other intervertebral disc displacement, lumbar region: Secondary | ICD-10-CM | POA: Diagnosis not present

## 2021-09-26 DIAGNOSIS — Z85828 Personal history of other malignant neoplasm of skin: Secondary | ICD-10-CM | POA: Diagnosis not present

## 2021-09-26 DIAGNOSIS — L570 Actinic keratosis: Secondary | ICD-10-CM | POA: Diagnosis not present

## 2021-09-26 DIAGNOSIS — L578 Other skin changes due to chronic exposure to nonionizing radiation: Secondary | ICD-10-CM | POA: Diagnosis not present

## 2021-09-26 DIAGNOSIS — Z872 Personal history of diseases of the skin and subcutaneous tissue: Secondary | ICD-10-CM | POA: Diagnosis not present

## 2021-09-26 DIAGNOSIS — L918 Other hypertrophic disorders of the skin: Secondary | ICD-10-CM | POA: Diagnosis not present

## 2021-09-26 DIAGNOSIS — L720 Epidermal cyst: Secondary | ICD-10-CM | POA: Diagnosis not present

## 2021-09-26 DIAGNOSIS — L821 Other seborrheic keratosis: Secondary | ICD-10-CM | POA: Diagnosis not present

## 2021-09-27 NOTE — Progress Notes (Shared)
[facility] Note  09/28/2021     CHIEF COMPLAINT Patient presents for Retina Follow Up    HISTORY OF PRESENT ILLNESS: Kathryn Kemp is a 81 y.o. female who presents to the clinic today for:   HPI     Retina Follow Up           Diagnosis: Dry AMD   Laterality: both eyes   Onset: years ago   Duration: 4 months   Course: gradually worsening   MD Performed: performed the HPI with the patient and updated documentation appropriately         Comments   Patient feels that the vision is not as good as it used to be. She admits to seeing floaters.       Last edited by Annie Paras, COT on 09/28/2021  9:13 AM.      Pt states her vision is a little fuzzy, she is taking AREDS 2 and using an amsler grid to monitor her vision, she states no major changes notices, pt states eyes are very dry  Referring physician: Rusty Aus, [place],  [place] [id number]  HISTORICAL INFORMATION:   Selected notes from the MEDICAL RECORD NUMBER Referred by Dr. Kathlen Mody for ARMD OU/Geographic Atrophy OD LEE: 07.07.22 Ocular Hx-ARMD, Pseudophakia PMH-    CURRENT MEDICATIONS: No current outpatient medications on file. (Ophthalmic Drugs)   No current facility-administered medications for this visit. (Ophthalmic Drugs)   Current Outpatient Medications (Other)  Medication Sig   Calcium-Vitamin D 600-200 MG-UNIT tablet Take by mouth.   etodolac (LODINE) 400 MG tablet Take 400 mg by mouth every morning.   gabapentin (NEURONTIN) 100 MG capsule Take by mouth.   hydrochlorothiazide (HYDRODIURIL) 25 MG tablet Take 25 mg by mouth daily.   meloxicam (MOBIC) 7.5 MG tablet Take 7.5 mg by mouth daily.   montelukast (SINGULAIR) 10 MG tablet Take 10 mg by mouth at bedtime.   Multiple Vitamins-Minerals (PRESERVISION AREDS 2 PO) Take by mouth.   omeprazole (PRILOSEC) 20 MG capsule Take by mouth.   pravastatin  (PRAVACHOL) 40 MG tablet Take 40 mg by mouth daily.   No current facility-administered medications for this visit. (Other)   REVIEW OF SYSTEMS: ROS   Positive for: Gastrointestinal, Skin, Musculoskeletal, Endocrine, Eyes Negative for: Constitutional, Neurological, Genitourinary, HENT, Cardiovascular, Respiratory, Psychiatric, Allergic/Imm, Heme/Lymph Last edited by Annie Paras, COT on 09/28/2021  9:13 AM.      ALLERGIES No Known Allergies  PAST MEDICAL HISTORY Past Medical History:  Diagnosis Date   Arthritis    Cancer ([place])    skin   Esophagitis    GERD (gastroesophageal reflux disease)    Hemorrhoid    external   Herpes zoster    Hyperlipemia    Hypertension    Increased BMI    Menopause    Osteopenia    Osteopenia    Osteoporosis    Shingles    h/o breast   SUI (stress urinary incontinence, female)    Thyroid disease    Past Surgical History:  Procedure Laterality Date   CATARACT EXTRACTION     COLONOSCOPY     COLONOSCOPY WITH PROPOFOL N/A 09/18/2017   Procedure: COLONOSCOPY WITH PROPOFOL;  Surgeon: [name], MD;  Location[facility] ENDOSCOPY;  Service: Endoscopy;  Laterality: N/A;   TUBAL LIGATION     FAMILY HISTORY Family History  Problem Relation Age of Onset   Macular degeneration  Mother    Lung cancer Mother    Heart disease Mother    Osteoporosis Mother    Prostate cancer Father    Liver cancer Sister    Breast cancer Neg Hx    Diabetes Neg Hx    Colon cancer Neg Hx    Ovarian cancer Neg Hx    SOCIAL HISTORY Social History   Tobacco Use   Smoking status: Never   Smokeless tobacco: Never  Substance Use Topics   Alcohol use: No   Drug use: No       OPHTHALMIC EXAM: Base Eye Exam     Visual Acuity (Snellen - Linear)       Right Left   Dist [place] 20/30 -2 20/25 +2   Dist ph  NI          Tonometry (Tonopen, 9:17 AM)       Right Left   Pressure 15 16         Pupils       Pupils Dark Light Shape React APD    Right PERRL 4 3 Round Brisk None   Left PERRL 4 3 Round Brisk None         Visual Fields       Left Right    Full Full         Extraocular Movement       Right Left    Full, Ortho Full, Ortho         Neuro/Psych     Oriented x3: Yes   Mood/Affect: Normal         Dilation     Both eyes: 1.0% Mydriacyl, 2.5% Phenylephrine @ 9:15 AM           Slit Lamp and Fundus Exam     Slit Lamp Exam       Right Left   Lids/Lashes Dermatochalasis - upper lid, mild MGD Dermatochalasis - upper lid, mild MGD   Conjunctiva/Sclera White and quiet White and quiet   Cornea Arcus, well healed cataract wounds, trace PEE Arcus, well healed cataract wounds, 1+ PEE, tear film debris, trace fine endo pigment   Anterior Chamber Deep and quiet Deep and quiet   Iris Round and dilated Round and dilated   Lens PCIOL in good position, 1+PCO non central PCIOL in good position, trace PCO   Vitreous Vitreous syneresis, PVD Vitreous syneresis, PVD         Fundus Exam       Right Left   Disc Pink and sharp Pink and sharp   C/D Ratio 0.3 0.3   Macula Flat, Blunted foveal reflex, drusen, RPE mottling, clumping, and early atrophy, no heme or edema, no CNV Flat, Blunted foveal reflex, drusen, RPE mottling, clumping, and early atrophy, prominent focal pigment clump temporal mac, +small PED / vitelliform like lesion inferior to fovea   Vessels mild attenuation, mild tortuousity Tortuous   Periphery Attached, +reticular degeneration; no heme Attached, mild reticular degeneration, no heme            IMAGING AND PROCEDURES  Imaging and Procedures for 09/28/2021            ASSESSMENT/PLAN:    ICD-10-CM   1. Intermediate stage nonexudative age-related macular degeneration of both eyes  H35.3132 OCT, Retina - OU - Both Eyes    2. Essential hypertension  I10     3. Hypertensive retinopathy of both eyes  H35.033     4. Pseudophakia of both eyes  Z96.1     5. Dry eye  H04.129       1. Age related macular degeneration, non-exudative, both eyes  - +family hx of ARMD -- pt reports mother had wet AMD with significant vision loss  - pt has already been on AREDS 2 supplementation due to family history and has been monitoring her vision w/ Amsler grid provided by Dr. Kathlen Mody  - pt notes focal distortion superior paracentral OD             - Recommend amsler grid monitoring  - BCVA 20/30 OD - stable, 20/20 OS - stable  - OCT shows drusen OU; OD: Central ORA and drusen -- unable to compare to prior; OS: Central SHRM/vitelliform like lesion -- persistent, but unable to compare to prior  - FA 10.18.22 -- no leakage or CNV OU; mild staining of focal vitelliform like lesion OS -- inferior to fovea             - f/u 6 months, DFE, OCT  2,3. Hypertensive retinopathy OU - discussed importance of tight BP control - monitor   4. Pseudophakia OU  - s/p CE/IOL [facility])  - IOL in good position, doing well  - monitor   5. Dry Eye Syndrome OU            - Recommend Artificial tears QID OU  Ophthalmic Meds Ordered this visit:  No orders of the defined types were placed in this encounter.     This document serves as a record of services personally performed by Gardiner Sleeper, MD, PhD. It was created on their behalf by San Jetty. Owens Shark, OA an ophthalmic technician. The creation of this record is the provider's dictation and/or activities during the visit.    Electronically signed by: San Jetty. [place], [place] 06.21.2023 10:14 AM   Gardiner Sleeper, M.D., Ph.D. Diseases & Surgery of the Retina and Vitreous [facility]: M myopia (nearsighted); A astigmatism; H hyperopia (farsighted); P presbyopia; Mrx spectacle prescription;  CTL contact lenses; OD right eye; OS left eye; OU both eyes  XT exotropia; ET esotropia; PEK punctate epithelial keratitis; PEE punctate epithelial erosions; DES dry eye syndrome; MGD meibomian gland dysfunction; ATs  artificial tears; PFAT's preservative free artificial tears; [place] nuclear sclerotic cataract; PSC posterior subcapsular cataract; ERM epi-retinal membrane; PVD posterior vitreous detachment; RD retinal detachment; DM diabetes mellitus; DR diabetic retinopathy; NPDR non-proliferative diabetic retinopathy; PDR proliferative diabetic retinopathy; CSME clinically significant macular edema; DME diabetic macular edema; dbh dot blot hemorrhages; CWS cotton wool spot; POAG primary open angle glaucoma; C/D cup-to-disc ratio; HVF humphrey visual field; GVF goldmann visual field; OCT optical coherence tomography; IOP intraocular pressure; BRVO Branch retinal vein occlusion; CRVO central retinal vein occlusion; CRAO central retinal artery occlusion; BRAO branch retinal artery occlusion; RT retinal tear; SB scleral buckle; PPV pars plana vitrectomy; VH Vitreous hemorrhage; PRP panretinal laser photocoagulation; IVK intravitreal kenalog; VMT vitreomacular traction; MH Macular hole;  NVD neovascularization of the disc; NVE neovascularization elsewhere; AREDS age related eye disease study; ARMD age related macular degeneration; POAG primary open angle glaucoma; EBMD epithelial/anterior basement membrane dystrophy; ACIOL anterior chamber intraocular lens; IOL intraocular lens; PCIOL posterior chamber intraocular lens; Phaco/IOL phacoemulsification with intraocular lens placement; [place] photorefractive keratectomy; LASIK laser assisted in situ keratomileusis; HTN hypertension; DM diabetes mellitus; COPD chronic obstructive pulmonary disease

## 2021-09-28 ENCOUNTER — Encounter (INDEPENDENT_AMBULATORY_CARE_PROVIDER_SITE_OTHER): Payer: Self-pay | Admitting: Ophthalmology

## 2021-09-28 ENCOUNTER — Ambulatory Visit (INDEPENDENT_AMBULATORY_CARE_PROVIDER_SITE_OTHER): Payer: PPO | Admitting: Ophthalmology

## 2021-09-28 DIAGNOSIS — H04129 Dry eye syndrome of unspecified lacrimal gland: Secondary | ICD-10-CM | POA: Diagnosis not present

## 2021-09-28 DIAGNOSIS — I10 Essential (primary) hypertension: Secondary | ICD-10-CM

## 2021-09-28 DIAGNOSIS — H35033 Hypertensive retinopathy, bilateral: Secondary | ICD-10-CM

## 2021-09-28 DIAGNOSIS — Z961 Presence of intraocular lens: Secondary | ICD-10-CM

## 2021-09-28 DIAGNOSIS — H353132 Nonexudative age-related macular degeneration, bilateral, intermediate dry stage: Secondary | ICD-10-CM

## 2021-09-29 ENCOUNTER — Encounter (INDEPENDENT_AMBULATORY_CARE_PROVIDER_SITE_OTHER): Payer: Self-pay | Admitting: Ophthalmology

## 2021-09-29 ENCOUNTER — Encounter (INDEPENDENT_AMBULATORY_CARE_PROVIDER_SITE_OTHER): Payer: PPO | Admitting: Ophthalmology

## 2021-09-29 DIAGNOSIS — I10 Essential (primary) hypertension: Secondary | ICD-10-CM

## 2021-09-29 DIAGNOSIS — Z961 Presence of intraocular lens: Secondary | ICD-10-CM

## 2021-09-29 DIAGNOSIS — H353132 Nonexudative age-related macular degeneration, bilateral, intermediate dry stage: Secondary | ICD-10-CM

## 2021-09-29 DIAGNOSIS — H04129 Dry eye syndrome of unspecified lacrimal gland: Secondary | ICD-10-CM

## 2021-09-29 DIAGNOSIS — H35033 Hypertensive retinopathy, bilateral: Secondary | ICD-10-CM

## 2021-10-06 DIAGNOSIS — M5416 Radiculopathy, lumbar region: Secondary | ICD-10-CM | POA: Diagnosis not present

## 2021-10-06 DIAGNOSIS — M5136 Other intervertebral disc degeneration, lumbar region: Secondary | ICD-10-CM | POA: Diagnosis not present

## 2021-10-06 DIAGNOSIS — M5126 Other intervertebral disc displacement, lumbar region: Secondary | ICD-10-CM | POA: Diagnosis not present

## 2021-10-06 DIAGNOSIS — M48062 Spinal stenosis, lumbar region with neurogenic claudication: Secondary | ICD-10-CM | POA: Diagnosis not present

## 2021-11-02 DIAGNOSIS — H26493 Other secondary cataract, bilateral: Secondary | ICD-10-CM | POA: Diagnosis not present

## 2021-11-02 DIAGNOSIS — H353132 Nonexudative age-related macular degeneration, bilateral, intermediate dry stage: Secondary | ICD-10-CM | POA: Diagnosis not present

## 2021-11-02 DIAGNOSIS — H524 Presbyopia: Secondary | ICD-10-CM | POA: Diagnosis not present

## 2021-11-02 DIAGNOSIS — H04123 Dry eye syndrome of bilateral lacrimal glands: Secondary | ICD-10-CM | POA: Diagnosis not present

## 2021-11-20 DIAGNOSIS — M65331 Trigger finger, right middle finger: Secondary | ICD-10-CM | POA: Diagnosis not present

## 2021-11-20 DIAGNOSIS — M13849 Other specified arthritis, unspecified hand: Secondary | ICD-10-CM | POA: Diagnosis not present

## 2021-12-14 DIAGNOSIS — M65331 Trigger finger, right middle finger: Secondary | ICD-10-CM | POA: Diagnosis not present

## 2021-12-28 DIAGNOSIS — M25641 Stiffness of right hand, not elsewhere classified: Secondary | ICD-10-CM | POA: Diagnosis not present

## 2022-02-05 ENCOUNTER — Encounter (INDEPENDENT_AMBULATORY_CARE_PROVIDER_SITE_OTHER): Payer: Self-pay

## 2022-02-08 DIAGNOSIS — Z03818 Encounter for observation for suspected exposure to other biological agents ruled out: Secondary | ICD-10-CM | POA: Diagnosis not present

## 2022-02-08 DIAGNOSIS — J019 Acute sinusitis, unspecified: Secondary | ICD-10-CM | POA: Diagnosis not present

## 2022-03-29 ENCOUNTER — Other Ambulatory Visit: Payer: Self-pay

## 2022-03-29 DIAGNOSIS — Z1231 Encounter for screening mammogram for malignant neoplasm of breast: Secondary | ICD-10-CM

## 2022-03-29 NOTE — Progress Notes (Signed)
[facility] Note  03/30/2022     CHIEF COMPLAINT Patient presents for Retina Follow Up   HISTORY OF PRESENT ILLNESS: Kathryn Kemp is a 81 y.o. female who presents to the clinic today for:   HPI     Retina Follow Up   Patient presents with  Dry AMD.  In both eyes.  This started years ago.  Duration of 6 months.  Since onset it is gradually worsening.  I, the attending physician,  performed the HPI with the patient and updated documentation appropriately.        Comments   Patient states that she had an episode of wavy and blurry vision of the left eye for about 10 min then the vision cleared up. She is using AT's OU PRN. There have been no changes in her general health.       Last edited by Bernarda Caffey, MD on 03/31/2022  2:11 AM.    Pt states glare from lights bothers her a lot, she states she saw "wavy lines" in her left eye vision a few weeks ago, she states she has had this before, it is not accompanied by a headache  Referring physician: Rusty Aus, MD [place],  [place] [address]  HISTORICAL INFORMATION:   Selected notes from the MEDICAL RECORD NUMBER Referred by Dr. Kathlen Mody for ARMD OU/Geographic Atrophy OD LEE: 07.07.22 Ocular Hx-ARMD, Pseudophakia PMH-    CURRENT MEDICATIONS: Current Outpatient Medications (Ophthalmic Drugs)  Medication Sig   prednisoLONE acetate (PRED FORTE) 1 % ophthalmic suspension Place 1 drop into the right eye 4 (four) times daily for 7 days.   No current facility-administered medications for this visit. (Ophthalmic Drugs)   Current Outpatient Medications (Other)  Medication Sig   Calcium-Vitamin D 600-200 MG-UNIT tablet Take by mouth.   etodolac (LODINE) 400 MG tablet Take 400 mg by mouth every morning.   gabapentin (NEURONTIN) 100 MG capsule Take by mouth.   hydrochlorothiazide (HYDRODIURIL) 25 MG tablet Take 25 mg by mouth daily.   meloxicam  (MOBIC) 7.5 MG tablet Take 7.5 mg by mouth daily.   montelukast (SINGULAIR) 10 MG tablet Take 10 mg by mouth at bedtime.   Multiple Vitamins-Minerals (PRESERVISION AREDS 2 PO) Take by mouth.   omeprazole (PRILOSEC) 20 MG capsule Take by mouth.   pravastatin (PRAVACHOL) 40 MG tablet Take 40 mg by mouth daily.   No current facility-administered medications for this visit. (Other)   REVIEW OF SYSTEMS: ROS   Positive for: Gastrointestinal, Skin, Musculoskeletal, Endocrine, Eyes Negative for: Constitutional, Neurological, Genitourinary, HENT, Cardiovascular, Respiratory, Psychiatric, Allergic/Imm, Heme/Lymph Last edited by Annie Paras, COT on 03/30/2022  9:37 AM.     ALLERGIES No Known Allergies  PAST MEDICAL HISTORY Past Medical History:  Diagnosis Date   Arthritis    Cancer ([place])    skin   Esophagitis    GERD (gastroesophageal reflux disease)    Hemorrhoid    external   Herpes zoster    Hyperlipemia    Hypertension    Increased BMI    Menopause    Osteopenia    Osteopenia    Osteoporosis    Shingles    h/o breast   SUI (stress urinary incontinence, female)    Thyroid disease    Past Surgical History:  Procedure Laterality Date   CATARACT EXTRACTION     COLONOSCOPY     COLONOSCOPY WITH PROPOFOL N/A 09/18/2017   Procedure:  COLONOSCOPY WITH PROPOFOL;  Surgeon: [name], MD;  Location: [facility] ENDOSCOPY;  Service: Endoscopy;  Laterality: N/A;   TUBAL LIGATION     FAMILY HISTORY Family History  Problem Relation Age of Onset   Macular degeneration Mother    Lung cancer Mother    Heart disease Mother    Osteoporosis Mother    Prostate cancer Father    Liver cancer Sister    Breast cancer Neg Hx    Diabetes Neg Hx    Colon cancer Neg Hx    Ovarian cancer Neg Hx    SOCIAL HISTORY Social History   Tobacco Use   Smoking status: Never   Smokeless tobacco: Never  Substance Use Topics   Alcohol use: No   Drug use: No       OPHTHALMIC  EXAM: Base Eye Exam     Visual Acuity (Snellen - Linear)       Right Left   Dist [place] 20/30 20/25   Dist ph [place] NI          Tonometry (Tonopen, 9:41 AM)       Right Left   Pressure 19 17         Pupils       Dark Light Shape React APD   Right 4 3 Round Brisk None   Left 4 3 Round Brisk None         Visual Fields       Left Right    Full Full         Extraocular Movement       Right Left    Full, Ortho Full, Ortho         Neuro/Psych     Oriented x3: Yes   Mood/Affect: Normal         Dilation     Both eyes: 1.0% Mydriacyl, 2.5% Phenylephrine @ 9:38 AM           Slit Lamp and Fundus Exam     Slit Lamp Exam       Right Left   Lids/Lashes Dermatochalasis - upper lid, Meibomian gland dysfunction Dermatochalasis - upper lid, mild MGD   Conjunctiva/Sclera White and quiet White and quiet   Cornea Arcus, well healed cataract wounds Arcus, well healed cataract wounds, 1-2+ inferior PEE   Anterior Chamber Deep and quiet Deep and quiet   Iris Round and dilated Round and dilated   Lens PCIOL in good position, 1-2+PCO non central PCIOL in good position, 1+ inferior PCO   Anterior Vitreous Vitreous syneresis, PVD Vitreous syneresis, PVD         Fundus Exam       Right Left   Disc Pink and sharp, Compact Pink and sharp   C/D Ratio 0.3 0.3   Macula Flat, Blunted foveal reflex, drusen, RPE mottling, clumping, and central atrophy, no heme or edema, no CNV Flat, Blunted foveal reflex, drusen, RPE mottling, clumping, and early atrophy, prominent focal pigment clump temporal mac -- improved, +small PED / vitelliform like lesion inferior to fovea   Vessels attenuated, Tortuous attenuated, Tortuous, AV crossing changes   Periphery Attached, +reticular degeneration; no heme Attached, mild reticular degeneration, no heme           IMAGING AND PROCEDURES  Imaging and Procedures for 03/30/2022  OCT, Retina - OU - Both Eyes       Right Eye Quality  was good. Central Foveal Thickness: 245. Progression has been stable. Findings include normal foveal  contour, no IRF, no SRF, retinal drusen , outer retinal atrophy (Central ORA and drusen).   Left Eye Quality was good. Central Foveal Thickness: 314. Progression has improved. Findings include normal foveal contour, no IRF, no SRF, retinal drusen , subretinal hyper-reflective material, pigment epithelial detachment (Mild interval improvement in central SHRM/vitelliform like lesion).   Notes *Images captured and stored on drive  Diagnosis / Impression:  Non exudative ARMD OU OD: Central ORA and drusen  OS: Mild interval improvement in central SHRM/vitelliform like lesion  Clinical management:  See below  Abbreviations: NFP - Normal foveal profile. CME - cystoid macular edema. PED - pigment epithelial detachment. IRF - intraretinal fluid. SRF - subretinal fluid. EZ - ellipsoid zone. ERM - epiretinal membrane. ORA - outer retinal atrophy. ORT - outer retinal tubulation. SRHM - subretinal hyper-reflective material. IRHM - intraretinal hyper-reflective material      Yag Capsulotomy - OD - Right Eye       Procedure note: YAG Capsulotomy, RIGHT Eye  Informed consent obtained. Pre-op dilating drops (1% Topicamide and 2.5% Phenylephrine), and topical anesthesia given. Power: 7.9 mJ Shots: 9 Posterior capsulotomy in cruciate formation performed without difficulty. Patient tolerated procedure well. No complications. Rx pred forte 4 times a day for 7 days, then stop. Pt received written and verbal post laser education. Recheck in 2-3 weeks w/ dilated exam.            ASSESSMENT/PLAN:    ICD-10-CM   1. Intermediate stage nonexudative age-related macular degeneration of both eyes  H35.3132 OCT, Retina - OU - Both Eyes    2. Essential hypertension  I10     3. Hypertensive retinopathy of both eyes  H35.033     4. Pseudophakia of both eyes  Z96.1     5. Dry eye  H04.129     6.  PCO (posterior capsular opacification), bilateral  H26.493 Yag Capsulotomy - OD - Right Eye     1. Age related macular degeneration, non-exudative, both eyes  - +family hx of ARMD -- pt reports mother had wet AMD with significant vision loss  - pt has already been on AREDS 2 supplementation due to family history and has been monitoring her vision w/ Amsler grid provided by Dr. Kathlen Mody  - pt notes focal distortion superior paracentral OD             - Recommend amsler grid monitoring  - BCVA 20/30 OD - stable, 20/25 OS - stable  - OCT shows OD: Central ORA and drusen ; OS: Mild interval improvement in central SHRM/vitelliform like lesion  - FA 10.18.22 -- no leakage or CNV OU; mild staining of focal vitelliform like lesion OS -- inferior to fovea             - f/u 6-9 months, DFE, OCT  2,3. Hypertensive retinopathy OU - discussed importance of tight BP control - monitor  4. Pseudophakia OU  - s/p CE/IOL [facility])  - IOL in good position, doing well  - monitor  5. Dry Eye Syndrome OU            - Recommend Artificial tears QID OU  6. PCO OU  - pt reports significant glare symptoms OU  - recommend Yag OD today, 12.22.23  - pt wishes to proceed with Yag Cap  - RBA of procedure discussed, questions answered - informed consent obtained and signed - see procedure note - start PF QID OD x7 days - f/u 2-3 weeks, possible yag  OS   Ophthalmic Meds Ordered this visit:  Meds ordered this encounter  Medications   prednisoLONE acetate (PRED FORTE) 1 % ophthalmic suspension    Sig: Place 1 drop into the right eye 4 (four) times daily for 7 days.    Dispense:  10 mL    Refill:  0     This document serves as a record of services personally performed by Gardiner Sleeper, MD, PhD. It was created on their behalf by San Jetty. Owens Shark, OA an ophthalmic technician. The creation of this record is the provider's dictation and/or activities during the visit.    Electronically signed by:  San Jetty. Owens Shark, [place] 12.21.2023 2:13 AM   Gardiner Sleeper, M.D., Ph.D. Diseases & Surgery of the Retina and Vitreous [facility]  I have reviewed the above documentation for accuracy and completeness, and I agree with the above. Gardiner Sleeper, M.D., Ph.D. 03/31/22 2:13 AM  Abbreviations: M myopia (nearsighted); A astigmatism; H hyperopia (farsighted); P presbyopia; Mrx spectacle prescription;  CTL contact lenses; OD right eye; OS left eye; OU both eyes  XT exotropia; ET esotropia; PEK punctate epithelial keratitis; PEE punctate epithelial erosions; DES dry eye syndrome; MGD meibomian gland dysfunction; ATs artificial tears; PFAT's preservative free artificial tears; [place] nuclear sclerotic cataract; PSC posterior subcapsular cataract; ERM epi-retinal membrane; PVD posterior vitreous detachment; RD retinal detachment; DM diabetes mellitus; DR diabetic retinopathy; NPDR non-proliferative diabetic retinopathy; PDR proliferative diabetic retinopathy; CSME clinically significant macular edema; DME diabetic macular edema; dbh dot blot hemorrhages; CWS cotton wool spot; POAG primary open angle glaucoma; C/D cup-to-disc ratio; HVF humphrey visual field; GVF goldmann visual field; OCT optical coherence tomography; IOP intraocular pressure; BRVO Branch retinal vein occlusion; CRVO central retinal vein occlusion; CRAO central retinal artery occlusion; BRAO branch retinal artery occlusion; RT retinal tear; SB scleral buckle; PPV pars plana vitrectomy; VH Vitreous hemorrhage; PRP panretinal laser photocoagulation; IVK intravitreal kenalog; VMT vitreomacular traction; MH Macular hole;  NVD neovascularization of the disc; NVE neovascularization elsewhere; AREDS age related eye disease study; ARMD age related macular degeneration; POAG primary open angle glaucoma; EBMD epithelial/anterior basement membrane dystrophy; ACIOL anterior chamber intraocular lens; IOL intraocular lens; PCIOL posterior  chamber intraocular lens; Phaco/IOL phacoemulsification with intraocular lens placement; [place] photorefractive keratectomy; LASIK laser assisted in situ keratomileusis; HTN hypertension; DM diabetes mellitus; COPD chronic obstructive pulmonary disease

## 2022-03-30 ENCOUNTER — Encounter (INDEPENDENT_AMBULATORY_CARE_PROVIDER_SITE_OTHER): Payer: Self-pay | Admitting: Ophthalmology

## 2022-03-30 ENCOUNTER — Ambulatory Visit (INDEPENDENT_AMBULATORY_CARE_PROVIDER_SITE_OTHER): Payer: PPO | Admitting: Ophthalmology

## 2022-03-30 DIAGNOSIS — H35033 Hypertensive retinopathy, bilateral: Secondary | ICD-10-CM

## 2022-03-30 DIAGNOSIS — H04129 Dry eye syndrome of unspecified lacrimal gland: Secondary | ICD-10-CM | POA: Diagnosis not present

## 2022-03-30 DIAGNOSIS — I10 Essential (primary) hypertension: Secondary | ICD-10-CM

## 2022-03-30 DIAGNOSIS — Z961 Presence of intraocular lens: Secondary | ICD-10-CM | POA: Diagnosis not present

## 2022-03-30 DIAGNOSIS — H353132 Nonexudative age-related macular degeneration, bilateral, intermediate dry stage: Secondary | ICD-10-CM

## 2022-03-30 DIAGNOSIS — H26493 Other secondary cataract, bilateral: Secondary | ICD-10-CM

## 2022-03-30 MED ORDER — PREDNISOLONE ACETATE 1 % OP SUSP: 1.0000 [drp] | Freq: Four times a day (QID) | OPHTHALMIC | 0 refills | Status: AC

## 2022-04-18 NOTE — Progress Notes (Signed)
[facility] Note  04/20/2022     CHIEF COMPLAINT Patient presents for Retina Follow Up   HISTORY OF PRESENT ILLNESS: Kathryn Kemp is a 82 y.o. female who presents to the clinic today for:   HPI     Retina Follow Up   Patient presents with  Dry AMD.  In both eyes.  Severity is moderate.  Duration of 3 weeks.  Since onset it is stable.  I, the attending physician,  performed the HPI with the patient and updated documentation appropriately.        Comments   Pt here for 3 wk ret f/u s/p YAG OD/non exu ARMD OU. PT states VA has improved in OD but still seeing floaters OD.       Last edited by Bernarda Caffey, MD on 04/20/2022  4:11 PM.    Pt feels like vision in the right eye has improved since the yag procedure, she still has floaters OD, but no fol  Referring physician: Rusty Aus, MD [place],  [place] [address]  HISTORICAL INFORMATION:   Selected notes from the MEDICAL RECORD NUMBER Referred by Dr. Kathlen Mody for ARMD OU/Geographic Atrophy OD LEE: 07.07.22 Ocular Hx-ARMD, Pseudophakia PMH-    CURRENT MEDICATIONS: No current outpatient medications on file. (Ophthalmic Drugs)   No current facility-administered medications for this visit. (Ophthalmic Drugs)   Current Outpatient Medications (Other)  Medication Sig   Calcium-Vitamin D 600-200 MG-UNIT tablet Take by mouth.   etodolac (LODINE) 400 MG tablet Take 400 mg by mouth every morning.   gabapentin (NEURONTIN) 100 MG capsule Take by mouth.   hydrochlorothiazide (HYDRODIURIL) 25 MG tablet Take 25 mg by mouth daily.   meloxicam (MOBIC) 7.5 MG tablet Take 7.5 mg by mouth daily.   montelukast (SINGULAIR) 10 MG tablet Take 10 mg by mouth at bedtime.   Multiple Vitamins-Minerals (PRESERVISION AREDS 2 PO) Take by mouth.   omeprazole (PRILOSEC) 20 MG capsule Take by mouth.   pravastatin (PRAVACHOL) 40 MG tablet Take 40 mg by mouth daily.    No current facility-administered medications for this visit. (Other)   REVIEW OF SYSTEMS: ROS   Positive for: Gastrointestinal, Skin, Musculoskeletal, Endocrine, Eyes Negative for: Constitutional, Neurological, Genitourinary, HENT, Cardiovascular, Respiratory, Psychiatric, Allergic/Imm, Heme/Lymph Last edited by Kingsley Spittle, COT on 04/20/2022  7:45 AM.     ALLERGIES No Known Allergies  PAST MEDICAL HISTORY Past Medical History:  Diagnosis Date   Arthritis    Cancer ([place])    skin   Esophagitis    GERD (gastroesophageal reflux disease)    Hemorrhoid    external   Herpes zoster    Hyperlipemia    Hypertension    Increased BMI    Menopause    Osteopenia    Osteopenia    Osteoporosis    Shingles    h/o breast   SUI (stress urinary incontinence, female)    Thyroid disease    Past Surgical History:  Procedure Laterality Date   CATARACT EXTRACTION     COLONOSCOPY     COLONOSCOPY WITH PROPOFOL N/A 09/18/2017   Procedure: COLONOSCOPY WITH PROPOFOL;  Surgeon: [name], MD;  Location: [facility] END[facility]COPY;  Service: Endoscopy;  Laterality: N/A;   TUBAL LIGATION     FAMILY HISTORY Family History  Problem Relation Age of Onset   Macular degeneration Mother    Lung cancer Mother    Heart disease Mother  Osteoporosis Mother    Prostate cancer Father    Liver cancer Sister    Breast cancer Neg Hx    Diabetes Neg Hx    Colon cancer Neg Hx    Ovarian cancer Neg Hx    SOCIAL HISTORY Social History   Tobacco Use   Smoking status: Never   Smokeless tobacco: Never  Substance Use Topics   Alcohol use: No   Drug use: No       OPHTHALMIC EXAM: Base Eye Exam     Visual Acuity (Snellen - Linear)       Right Left   Dist [place] 20/30 20/25 -1   Dist ph [place] 20/30 +1          Tonometry (Tonopen, 7:50 AM)       Right Left   Pressure 15 14         Pupils       Pupils Dark Light Shape React APD   Right PERRL 4 3 Round Brisk None   Left PERRL 4 3  Round Brisk None         Visual Fields (Counting fingers)       Left Right    Full Full         Extraocular Movement       Right Left    Full, Ortho Full, Ortho         Neuro/Psych     Oriented x3: Yes   Mood/Affect: Normal         Dilation     Both eyes: 1.0% Mydriacyl, 2.5% Phenylephrine @ 7:50 AM           Slit Lamp and Fundus Exam     Slit Lamp Exam       Right Left   Lids/Lashes Dermatochalasis - upper lid, Meibomian gland dysfunction Dermatochalasis - upper lid, mild MGD   Conjunctiva/Sclera White and quiet White and quiet   Cornea Arcus, well healed cataract wounds Arcus, well healed cataract wounds, 1-2+ inferior PEE   Anterior Chamber Deep and quiet Deep and quiet   Iris Round and dilated Round and dilated   Lens PCIOL in good position with open PC PCIOL in good position, 1+ inferior PCO   Anterior Vitreous Vitreous syneresis, PVD Vitreous syneresis, PVD         Fundus Exam       Right Left   Disc Pink and sharp, Compact Pink and sharp   C/D Ratio 0.3 0.3   Macula Flat, Blunted foveal reflex, drusen, RPE mottling, clumping, and central atrophy, no heme or edema, no CNV Flat, Blunted foveal reflex, drusen, RPE mottling, clumping, and early atrophy, prominent focal pigment clump temporal mac -- improved, +small PED / vitelliform like lesion inferior to fovea   Vessels attenuated, Tortuous attenuated, Tortuous, AV crossing changes   Periphery Attached, +reticular degeneration; no heme, No RT/RD Attached, mild reticular degeneration, no heme           IMAGING AND PROCEDURES  Imaging and Procedures for 04/20/2022  OCT, Retina - OU - Both Eyes       Right Eye Quality was good. Central Foveal Thickness: 245. Progression has been stable. Findings include normal foveal contour, no IRF, no SRF, retinal drusen , outer retinal atrophy (Central ORA and drusen).   Left Eye Quality was good. Central Foveal Thickness: 309. Progression has been  stable. Findings include normal foveal contour, no IRF, no SRF, retinal drusen , subretinal hyper-reflective material, pigment epithelial detachment (  central SHRM/vitelliform like lesion).   Notes *Images captured and stored on drive  Diagnosis / Impression:  Non exudative ARMD OU OD: Central ORA and drusen  OS: central SHRM/vitelliform like lesion -- stable  Clinical management:  See below  Abbreviations: NFP - Normal foveal profile. CME - cystoid macular edema. PED - pigment epithelial detachment. IRF - intraretinal fluid. SRF - subretinal fluid. EZ - ellipsoid zone. ERM - epiretinal membrane. ORA - outer retinal atrophy. ORT - outer retinal tubulation. SRHM - subretinal hyper-reflective material. IRHM - intraretinal hyper-reflective material            ASSESSMENT/PLAN:    ICD-10-CM   1. Intermediate stage nonexudative age-related macular degeneration of both eyes  H35.3132 OCT, Retina - OU - Both Eyes    2. Essential hypertension  I10     3. Hypertensive retinopathy of both eyes  H35.033     4. Pseudophakia of both eyes  Z96.1     5. Dry eye  H04.129     6. PCO (posterior capsular opacification), bilateral  H26.493      1. Age related macular degeneration, non-exudative, both eyes  - +family hx of ARMD -- pt reports mother had wet AMD with significant vision loss  - pt has already been on AREDS 2 supplementation due to family history and has been monitoring her vision w/ [place] grid provided by Dr. Kathlen Mody  - pt notes focal distortion superior paracentral OD             - Recommend amsler grid monitoring  - BCVA 20/30 OD - stable, 20/25 OS - stable  - OCT shows OD: Central ORA and drusen; OS: central SHRM/vitelliform like lesion -- stable  - FA 10.18.22 -- no leakage or CNV OU; mild staining of focal vitelliform like lesion OS -- inferior to fovea             - f/u 4-6 months, DFE, OCT  2,3. Hypertensive retinopathy OU - discussed importance of tight BP control -  monitor  4. Pseudophakia OU  - s/p CE/IOL [facility])  - IOL in good position, doing well  - monitor  5. Dry Eye Syndrome OU            - recommend artificial tears qid OU  6. PCO OU  - pt reports significant glare symptoms OU  - s/p Yag OD (12.22.23) -- good PC opening  - pt reports subjective improvement in vision OD but BCVA remains 20/30 OD - completed PF QID OD x7 days - discussed possible YAG OS -- pt wishes to defer for now - f/u 4-6 mos, possible yag OS   Ophthalmic Meds Ordered this visit:  No orders of the defined types were placed in this encounter.    This document serves as a record of services personally performed by Gardiner Sleeper, MD, PhD. It was created on their behalf by Roselee Nova, COMT. The creation of this record is the provider's dictation and/or activities during the visit.  Electronically signed by: Roselee Nova, COMT 04/20/22 4:14 PM  This document serves as a record of services personally performed by Gardiner Sleeper, MD, PhD. It was created on their behalf by San Jetty. Owens Shark, OA an ophthalmic technician. The creation of this record is the provider's dictation and/or activities during the visit.    Electronically signed by: San Jetty. Owens Shark, [place] 01.12.2024 4:14 PM  Gardiner Sleeper, M.D., Ph.D. Diseases & Surgery of the Retina and Vitreous [facility] [place]  I have reviewed the above documentation for accuracy and completeness, and I agree with the above. Gardiner Sleeper, M.D., Ph.D. 04/20/22 4:14 PM   Abbreviations: M myopia (nearsighted); A astigmatism; H hyperopia (farsighted); P presbyopia; Mrx spectacle prescription;  CTL contact lenses; OD right eye; OS left eye; OU both eyes  XT exotropia; ET esotropia; PEK punctate epithelial keratitis; PEE punctate epithelial erosions; DES dry eye syndrome; MGD meibomian gland dysfunction; ATs artificial tears; PFAT's preservative free artificial tears; [place] nuclear sclerotic cataract;  PSC posterior subcapsular cataract; ERM epi-retinal membrane; PVD posterior vitreous detachment; RD retinal detachment; DM diabetes mellitus; DR diabetic retinopathy; NPDR non-proliferative diabetic retinopathy; PDR proliferative diabetic retinopathy; CSME clinically significant macular edema; DME diabetic macular edema; dbh dot blot hemorrhages; CWS cotton wool spot; POAG primary open angle glaucoma; C/D cup-to-disc ratio; HVF humphrey visual field; GVF goldmann visual field; OCT optical coherence tomography; IOP intraocular pressure; BRVO Branch retinal vein occlusion; CRVO central retinal vein occlusion; CRAO central retinal artery occlusion; BRAO branch retinal artery occlusion; RT retinal tear; SB scleral buckle; PPV pars plana vitrectomy; VH Vitreous hemorrhage; PRP panretinal laser photocoagulation; IVK intravitreal kenalog; VMT vitreomacular traction; MH Macular hole;  NVD neovascularization of the disc; NVE neovascularization elsewhere; AREDS age related eye disease study; ARMD age related macular degeneration; POAG primary open angle glaucoma; EBMD epithelial/anterior basement membrane dystrophy; ACIOL anterior chamber intraocular lens; IOL intraocular lens; PCIOL posterior chamber intraocular lens; Phaco/IOL phacoemulsification with intraocular lens placement; [place] photorefractive keratectomy; LASIK laser assisted in situ keratomileusis; HTN hypertension; DM diabetes mellitus; COPD chronic obstructive pulmonary disease

## 2022-04-20 ENCOUNTER — Ambulatory Visit (INDEPENDENT_AMBULATORY_CARE_PROVIDER_SITE_OTHER): Payer: PPO | Admitting: Ophthalmology

## 2022-04-20 ENCOUNTER — Encounter (INDEPENDENT_AMBULATORY_CARE_PROVIDER_SITE_OTHER): Payer: Self-pay | Admitting: Ophthalmology

## 2022-04-20 DIAGNOSIS — H26493 Other secondary cataract, bilateral: Secondary | ICD-10-CM

## 2022-04-20 DIAGNOSIS — H35033 Hypertensive retinopathy, bilateral: Secondary | ICD-10-CM

## 2022-04-20 DIAGNOSIS — H04129 Dry eye syndrome of unspecified lacrimal gland: Secondary | ICD-10-CM | POA: Diagnosis not present

## 2022-04-20 DIAGNOSIS — H353132 Nonexudative age-related macular degeneration, bilateral, intermediate dry stage: Secondary | ICD-10-CM

## 2022-04-20 DIAGNOSIS — I10 Essential (primary) hypertension: Secondary | ICD-10-CM

## 2022-04-20 DIAGNOSIS — Z961 Presence of intraocular lens: Secondary | ICD-10-CM | POA: Diagnosis not present

## 2022-05-04 ENCOUNTER — Ambulatory Visit
Admission: RE | Admit: 2022-05-04 | Discharge: 2022-05-04 | Disposition: A | Discharge status: Home / Self Care | Payer: PPO | Source: Ambulatory Visit | Attending: Internal Medicine | Admitting: Internal Medicine

## 2022-05-04 DIAGNOSIS — Z1231 Encounter for screening mammogram for malignant neoplasm of breast: Secondary | ICD-10-CM | POA: Diagnosis not present

## 2022-06-12 DIAGNOSIS — M5416 Radiculopathy, lumbar region: Secondary | ICD-10-CM | POA: Diagnosis not present

## 2022-06-12 DIAGNOSIS — M47816 Spondylosis without myelopathy or radiculopathy, lumbar region: Secondary | ICD-10-CM | POA: Diagnosis not present

## 2022-06-19 DIAGNOSIS — M47816 Spondylosis without myelopathy or radiculopathy, lumbar region: Secondary | ICD-10-CM | POA: Diagnosis not present

## 2022-06-19 DIAGNOSIS — R350 Frequency of micturition: Secondary | ICD-10-CM | POA: Diagnosis not present

## 2022-06-19 DIAGNOSIS — M5416 Radiculopathy, lumbar region: Secondary | ICD-10-CM | POA: Diagnosis not present

## 2022-06-20 ENCOUNTER — Other Ambulatory Visit: Payer: Self-pay | Admitting: Family Medicine

## 2022-06-20 DIAGNOSIS — M4716 Other spondylosis with myelopathy, lumbar region: Secondary | ICD-10-CM

## 2022-06-20 DIAGNOSIS — M5416 Radiculopathy, lumbar region: Secondary | ICD-10-CM

## 2022-06-26 ENCOUNTER — Ambulatory Visit
Admission: RE | Admit: 2022-06-26 | Discharge: 2022-06-26 | Disposition: A | Discharge status: Home / Self Care | Payer: PPO | Source: Ambulatory Visit | Attending: Family Medicine | Admitting: Family Medicine

## 2022-06-26 DIAGNOSIS — M25552 Pain in left hip: Secondary | ICD-10-CM | POA: Diagnosis not present

## 2022-06-26 DIAGNOSIS — M4716 Other spondylosis with myelopathy, lumbar region: Secondary | ICD-10-CM

## 2022-06-26 DIAGNOSIS — M5416 Radiculopathy, lumbar region: Secondary | ICD-10-CM

## 2022-06-26 DIAGNOSIS — M545 Low back pain, unspecified: Secondary | ICD-10-CM | POA: Diagnosis not present

## 2022-06-28 DIAGNOSIS — M5136 Other intervertebral disc degeneration, lumbar region: Secondary | ICD-10-CM | POA: Diagnosis not present

## 2022-06-28 DIAGNOSIS — M5416 Radiculopathy, lumbar region: Secondary | ICD-10-CM | POA: Diagnosis not present

## 2022-06-28 DIAGNOSIS — M5126 Other intervertebral disc displacement, lumbar region: Secondary | ICD-10-CM | POA: Diagnosis not present

## 2022-06-28 DIAGNOSIS — M48062 Spinal stenosis, lumbar region with neurogenic claudication: Secondary | ICD-10-CM | POA: Diagnosis not present

## 2022-07-02 DIAGNOSIS — M5416 Radiculopathy, lumbar region: Secondary | ICD-10-CM | POA: Diagnosis not present

## 2022-07-02 DIAGNOSIS — M5126 Other intervertebral disc displacement, lumbar region: Secondary | ICD-10-CM | POA: Diagnosis not present

## 2022-07-24 DIAGNOSIS — M5136 Other intervertebral disc degeneration, lumbar region: Secondary | ICD-10-CM | POA: Diagnosis not present

## 2022-07-24 DIAGNOSIS — M48062 Spinal stenosis, lumbar region with neurogenic claudication: Secondary | ICD-10-CM | POA: Diagnosis not present

## 2022-07-24 DIAGNOSIS — M5126 Other intervertebral disc displacement, lumbar region: Secondary | ICD-10-CM | POA: Diagnosis not present

## 2022-07-24 DIAGNOSIS — M5416 Radiculopathy, lumbar region: Secondary | ICD-10-CM | POA: Diagnosis not present

## 2022-07-27 DIAGNOSIS — M5126 Other intervertebral disc displacement, lumbar region: Secondary | ICD-10-CM | POA: Diagnosis not present

## 2022-07-27 DIAGNOSIS — M5116 Intervertebral disc disorders with radiculopathy, lumbar region: Secondary | ICD-10-CM | POA: Diagnosis not present

## 2022-07-27 DIAGNOSIS — M5136 Other intervertebral disc degeneration, lumbar region: Secondary | ICD-10-CM | POA: Diagnosis not present

## 2022-07-27 DIAGNOSIS — M48062 Spinal stenosis, lumbar region with neurogenic claudication: Secondary | ICD-10-CM | POA: Diagnosis not present

## 2022-08-03 DIAGNOSIS — M5126 Other intervertebral disc displacement, lumbar region: Secondary | ICD-10-CM | POA: Diagnosis not present

## 2022-08-03 DIAGNOSIS — M5136 Other intervertebral disc degeneration, lumbar region: Secondary | ICD-10-CM | POA: Diagnosis not present

## 2022-08-03 DIAGNOSIS — M48062 Spinal stenosis, lumbar region with neurogenic claudication: Secondary | ICD-10-CM | POA: Diagnosis not present

## 2022-08-03 DIAGNOSIS — M5116 Intervertebral disc disorders with radiculopathy, lumbar region: Secondary | ICD-10-CM | POA: Diagnosis not present

## 2022-08-08 DIAGNOSIS — M48062 Spinal stenosis, lumbar region with neurogenic claudication: Secondary | ICD-10-CM | POA: Diagnosis not present

## 2022-08-08 DIAGNOSIS — M5116 Intervertebral disc disorders with radiculopathy, lumbar region: Secondary | ICD-10-CM | POA: Diagnosis not present

## 2022-08-08 DIAGNOSIS — M5136 Other intervertebral disc degeneration, lumbar region: Secondary | ICD-10-CM | POA: Diagnosis not present

## 2022-08-08 DIAGNOSIS — M5126 Other intervertebral disc displacement, lumbar region: Secondary | ICD-10-CM | POA: Diagnosis not present

## 2022-08-17 DIAGNOSIS — M5116 Intervertebral disc disorders with radiculopathy, lumbar region: Secondary | ICD-10-CM | POA: Diagnosis not present

## 2022-08-17 DIAGNOSIS — M5136 Other intervertebral disc degeneration, lumbar region: Secondary | ICD-10-CM | POA: Diagnosis not present

## 2022-08-17 DIAGNOSIS — M5126 Other intervertebral disc displacement, lumbar region: Secondary | ICD-10-CM | POA: Diagnosis not present

## 2022-08-17 DIAGNOSIS — M48062 Spinal stenosis, lumbar region with neurogenic claudication: Secondary | ICD-10-CM | POA: Diagnosis not present

## 2022-08-22 DIAGNOSIS — E782 Mixed hyperlipidemia: Secondary | ICD-10-CM | POA: Diagnosis not present

## 2022-08-22 DIAGNOSIS — R739 Hyperglycemia, unspecified: Secondary | ICD-10-CM | POA: Diagnosis not present

## 2022-08-22 DIAGNOSIS — E538 Deficiency of other specified B group vitamins: Secondary | ICD-10-CM | POA: Diagnosis not present

## 2022-08-23 DIAGNOSIS — M5136 Other intervertebral disc degeneration, lumbar region: Secondary | ICD-10-CM | POA: Diagnosis not present

## 2022-08-23 DIAGNOSIS — M48062 Spinal stenosis, lumbar region with neurogenic claudication: Secondary | ICD-10-CM | POA: Diagnosis not present

## 2022-08-23 DIAGNOSIS — M5116 Intervertebral disc disorders with radiculopathy, lumbar region: Secondary | ICD-10-CM | POA: Diagnosis not present

## 2022-08-23 DIAGNOSIS — M5126 Other intervertebral disc displacement, lumbar region: Secondary | ICD-10-CM | POA: Diagnosis not present

## 2022-08-28 DIAGNOSIS — M5126 Other intervertebral disc displacement, lumbar region: Secondary | ICD-10-CM | POA: Diagnosis not present

## 2022-08-28 DIAGNOSIS — M48062 Spinal stenosis, lumbar region with neurogenic claudication: Secondary | ICD-10-CM | POA: Diagnosis not present

## 2022-08-28 DIAGNOSIS — M5116 Intervertebral disc disorders with radiculopathy, lumbar region: Secondary | ICD-10-CM | POA: Diagnosis not present

## 2022-08-28 DIAGNOSIS — M5136 Other intervertebral disc degeneration, lumbar region: Secondary | ICD-10-CM | POA: Diagnosis not present

## 2022-08-29 DIAGNOSIS — Z780 Asymptomatic menopausal state: Secondary | ICD-10-CM | POA: Diagnosis not present

## 2022-08-29 DIAGNOSIS — M19041 Primary osteoarthritis, right hand: Secondary | ICD-10-CM | POA: Diagnosis not present

## 2022-08-29 DIAGNOSIS — Z0000 Encounter for general adult medical examination without abnormal findings: Secondary | ICD-10-CM | POA: Diagnosis not present

## 2022-08-29 DIAGNOSIS — E782 Mixed hyperlipidemia: Secondary | ICD-10-CM | POA: Diagnosis not present

## 2022-08-29 DIAGNOSIS — M8588 Other specified disorders of bone density and structure, other site: Secondary | ICD-10-CM | POA: Diagnosis not present

## 2022-08-29 DIAGNOSIS — M65331 Trigger finger, right middle finger: Secondary | ICD-10-CM | POA: Diagnosis not present

## 2022-08-29 DIAGNOSIS — I10 Essential (primary) hypertension: Secondary | ICD-10-CM | POA: Diagnosis not present

## 2022-08-29 DIAGNOSIS — R739 Hyperglycemia, unspecified: Secondary | ICD-10-CM | POA: Diagnosis not present

## 2022-08-29 DIAGNOSIS — M5416 Radiculopathy, lumbar region: Secondary | ICD-10-CM | POA: Diagnosis not present

## 2022-08-29 DIAGNOSIS — M19042 Primary osteoarthritis, left hand: Secondary | ICD-10-CM | POA: Diagnosis not present

## 2022-08-29 DIAGNOSIS — M47816 Spondylosis without myelopathy or radiculopathy, lumbar region: Secondary | ICD-10-CM | POA: Diagnosis not present

## 2022-08-30 NOTE — Progress Notes (Signed)
[facility] - Clinic Note  09/13/2022     CHIEF COMPLAINT Patient presents for Retina Follow Up   HISTORY OF PRESENT ILLNESS: Kathryn Kemp is a 82 y.o. female who presents to the clinic today for:   HPI     Retina Follow Up   Patient presents with  Dry AMD.  In both eyes.  This started years ago.  Severity is moderate.  Duration of 5 months.  Since onset it is stable.  I, the attending physician,  performed the HPI with the patient and updated documentation appropriately.        Comments   Patient feels that the vision is the same since her last visit. She is using AT's OU PRN.       Last edited by Kathryn Chris, MD on 09/13/2022 12:03 PM.    Pt states she has not noticed any change in vision, she felt like she had a hard time reading the eye chart with her left eye today   Referring physician: Danella Penton, MD [address] [facility] [address] [facility] [place],  [place] [address]  HISTORICAL INFORMATION:   Selected notes from the MEDICAL RECORD NUMBER Referred by Dr. Alben Kemp for ARMD OU/Geographic Atrophy OD LEE: 07.07.22 Ocular Hx-ARMD, Pseudophakia PMH-    CURRENT MEDICATIONS: No current outpatient medications on file. (Ophthalmic Drugs)   No current facility-administered medications for this visit. (Ophthalmic Drugs)   Current Outpatient Medications (Other)  Medication Sig   Calcium-Vitamin D 600-200 MG-UNIT tablet Take by mouth.   hydrochlorothiazide (HYDRODIURIL) 25 MG tablet Take 25 mg by mouth daily.   Multiple Vitamins-Minerals (PRESERVISION AREDS 2 PO) Take by mouth.   omeprazole (PRILOSEC) 20 MG capsule Take by mouth.   pravastatin (PRAVACHOL) 40 MG tablet Take 40 mg by mouth daily.   etodolac (LODINE) 400 MG tablet Take 400 mg by mouth every morning. (Patient not taking: Reported on 09/13/2022)   gabapentin (NEURONTIN) 100 MG capsule Take by mouth. (Patient not taking: Reported on 09/13/2022)   meloxicam (MOBIC) 7.5 MG  tablet Take 7.5 mg by mouth daily. (Patient not taking: Reported on 09/13/2022)   montelukast (SINGULAIR) 10 MG tablet Take 10 mg by mouth at bedtime. (Patient not taking: Reported on 09/13/2022)   No current facility-administered medications for this visit. (Other)   REVIEW OF SYSTEMS: ROS   Positive for: Gastrointestinal, Skin, Musculoskeletal, Endocrine, Eyes Negative for: Constitutional, Neurological, Genitourinary, HENT, Cardiovascular, Respiratory, Psychiatric, Allergic/Imm, Heme/Lymph Last edited by Kathryn Kemp, COT on 09/13/2022  9:03 AM.     ALLERGIES No Known Allergies  PAST MEDICAL HISTORY Past Medical History:  Diagnosis Date   Arthritis    Cancer (HCC)    skin   Esophagitis    GERD (gastroesophageal reflux disease)    Hemorrhoid    external   Herpes zoster    Hyperlipemia    Hypertension    Increased BMI    Menopause    Osteopenia    Osteopenia    Osteoporosis    Shingles    h/o breast   SUI (stress urinary incontinence, female)    Thyroid disease    Past Surgical History:  Procedure Laterality Date   CATARACT EXTRACTION     COLONOSCOPY     COLONOSCOPY WITH PROPOFOL N/A 09/18/2017   Procedure: COLONOSCOPY WITH PROPOFOL;  Surgeon: Kathryn Jun, MD;  Location: [facility] ENDOSCOPY;  Service: Endoscopy;  Laterality: N/A;   TUBAL LIGATION     FAMILY HISTORY Family  History  Problem Relation Age of Onset   Macular degeneration Mother    Lung cancer Mother    Heart disease Mother    Osteoporosis Mother    Prostate cancer Father    Liver cancer Sister    Breast cancer Neg Hx    Diabetes Neg Hx    Colon cancer Neg Hx    Ovarian cancer Neg Hx    SOCIAL HISTORY Social History   Tobacco Use   Smoking status: Never   Smokeless tobacco: Never  Substance Use Topics   Alcohol use: No   Drug use: No       OPHTHALMIC EXAM: Base Eye Exam     Visual Acuity (Snellen - Linear)       Right Left   Dist Kathryn Kemp 20/30 20/25 +1   Dist ph  NI NI          Tonometry (Tonopen, 9:07 AM)       Right Left   Pressure 15 15         Pupils       Dark Light Shape React APD   Right 4 3 Round Brisk None   Left 4 3 Round Brisk None         Visual Fields       Left Right    Full Full         Extraocular Movement       Right Left    Full, Ortho Full, Ortho         Neuro/Psych     Oriented x3: Yes   Mood/Affect: Normal         Dilation     Both eyes: 1.0% Mydriacyl, 2.5% Phenylephrine @ 9:04 AM           Slit Lamp and Fundus Exam     Slit Lamp Exam       Right Left   Lids/Lashes Dermatochalasis - upper lid, Meibomian gland dysfunction Dermatochalasis - upper lid, mild MGD   Conjunctiva/Sclera White and quiet White and quiet   Cornea Arcus, well healed cataract wounds Arcus, well healed cataract wounds, 1-2+ inferior PEE   Anterior Chamber Deep and quiet Deep and quiet   Iris Round and dilated Round and dilated   Lens PCIOL in good position with open PC PCIOL in good position, 1+ inferior PCO   Anterior Vitreous Vitreous syneresis, PVD Vitreous syneresis, PVD         Fundus Exam       Right Left   Disc Pink and sharp, Compact Pink and sharp, PPA   C/D Ratio 0.3 0.3   Macula Flat, Blunted foveal reflex, drusen, RPE mottling, clumping, and central atrophy, no heme or edema, no CNV, mild progression of central GA Flat, Blunted foveal reflex, drusen, RPE mottling, clumping, and early atrophy, prominent focal pigment clump temporal mac -- improved, +small PED / vitelliform like lesion inferior to fovea, No heme or edema   Vessels attenuated, Tortuous attenuated, Tortuous, AV crossing changes   Periphery Attached, +reticular degeneration; no heme, No RT/RD Attached, mild reticular degeneration, no heme           IMAGING AND PROCEDURES  Imaging and Procedures for 09/13/2022  OCT, Retina - OU - Both Eyes       Right Eye Quality was good. Central Foveal Thickness: 248. Progression has worsened. Findings  include normal foveal contour, no IRF, no SRF, retinal drusen , outer retinal atrophy (Central ORA and drusen, mild progression of  central ORA best seen on en face).   Left Eye Quality was good. Central Foveal Thickness: 302. Progression has been stable. Findings include normal foveal contour, no IRF, no SRF, retinal drusen , subretinal hyper-reflective material, pigment epithelial detachment (central SHRM/vitelliform like lesion).   Notes *Images captured and stored on drive  Diagnosis / Impression:  Non exudative ARMD OU OD: Central ORA and drusen, mild progression of central ORA best seen on en face OS: central SHRM/vitelliform like lesion -- stable  Clinical management:  See below  Abbreviations: NFP - Normal foveal profile. CME - cystoid macular edema. PED - pigment epithelial detachment. IRF - intraretinal fluid. SRF - subretinal fluid. EZ - ellipsoid zone. ERM - epiretinal membrane. ORA - outer retinal atrophy. ORT - outer retinal tubulation. SRHM - subretinal hyper-reflective material. IRHM - intraretinal hyper-reflective material      Yag Capsulotomy - OS - Left Eye       Procedure note: YAG Capsulotomy, LEFT Eye  Informed consent obtained. Pre-op dilating drops (1% Topicamide and 2.5% Phenylephrine), and topical anesthesia given. Power: 6.7 mJ Shots: 10 Posterior capsulotomy in cruciate formation performed without difficulty. Patient tolerated procedure well. No complications. Rx pred forte 4 times a day for 7 days, then stop. Pt received written and verbal post laser education. Recheck in 2-3 weeks w/ dilated exam.            ASSESSMENT/PLAN:    ICD-10-CM   1. Intermediate stage nonexudative age-related macular degeneration of both eyes  H35.3132 OCT, Retina - OU - Both Eyes    2. Essential hypertension  I10     3. Hypertensive retinopathy of both eyes  H35.033     4. Pseudophakia of both eyes  Z96.1     5. Dry eye  H04.129     6. PCO (posterior  capsular opacification), bilateral  H26.493 Yag Capsulotomy - OS - Left Eye     1. Age related macular degeneration, non-exudative, both eyes  - +family hx of ARMD -- pt reports mother had wet AMD with significant vision loss  - pt has already been on AREDS 2 supplementation due to family history and has been monitoring her vision w/ Amsler grid provided by Dr. Alben Kemp  - pt notes focal distortion superior paracentral OD             - Recommend amsler grid monitoring  - FA 10.18.22 -- no leakage or CNV OU; mild staining of focal vitelliform like lesion OS -- inferior to fovea   - BCVA 20/30 OD - stable, 20/25 OS - stable  - OCT shows OD: Central ORA and drusen, mild progression of central ORA best seen on en face; OS: central SHRM/vitelliform like lesion -- stable            - f/u 6 months, DFE/OCT  2,3. Hypertensive retinopathy OU - discussed importance of tight BP control - monitor  4. Pseudophakia OU  - s/p CE/IOL OU [facility])  - IOL in good position, doing well  - monitor  5. Dry Eye Syndrome OU            - recommend artificial tears qid OU  6. PCO OU  - pt reports significant glare symptoms OU  - s/p Yag OD (12.22.23) -- good PC opening  - pt reports subjective improvement in vision OD but BCVA remains 20/30 OD - recommend YAG OS today, 06.06.24 - pt wishes to proceed with Yag Cap - RBA of procedure discussed, questions  answered - informed consent obtained and signed - see procedure note - start PF QID OS x7 days - f/u 2 weeks   Ophthalmic Meds Ordered this visit:  No orders of the defined types were placed in this encounter.    This document serves as a record of services personally performed by Karie Chimera, MD, PhD. It was created on their behalf by Glee Arvin. Manson Passey, OA an ophthalmic technician. The creation of this record is the provider's dictation and/or activities during the visit.    Electronically signed by: Glee Arvin. Manson Passey, [place] 05.23.2024 12:03  PM   Karie Chimera, M.D., Ph.D. Diseases & Surgery of the Retina and Vitreous [facility]  I have reviewed the above documentation for accuracy and completeness, and I agree with the above. Karie Chimera, M.D., Ph.D. 09/13/22 12:04 PM  Abbreviations: M myopia (nearsighted); A astigmatism; H hyperopia (farsighted); P presbyopia; Mrx spectacle prescription;  CTL contact lenses; OD right eye; OS left eye; OU both eyes  XT exotropia; ET esotropia; PEK punctate epithelial keratitis; PEE punctate epithelial erosions; DES dry eye syndrome; MGD meibomian gland dysfunction; ATs artificial tears; PFAT's preservative free artificial tears; NSC nuclear sclerotic cataract; PSC posterior subcapsular cataract; ERM epi-retinal membrane; PVD posterior vitreous detachment; RD retinal detachment; DM diabetes mellitus; DR diabetic retinopathy; NPDR non-proliferative diabetic retinopathy; PDR proliferative diabetic retinopathy; CSME clinically significant macular edema; DME diabetic macular edema; dbh dot blot hemorrhages; CWS Kemp wool spot; POAG primary open angle glaucoma; C/D cup-to-disc ratio; HVF humphrey visual field; GVF goldmann visual field; OCT optical coherence tomography; IOP intraocular pressure; BRVO Branch retinal vein occlusion; CRVO central retinal vein occlusion; CRAO central retinal artery occlusion; BRAO branch retinal artery occlusion; RT retinal tear; SB scleral buckle; PPV pars plana vitrectomy; VH Vitreous hemorrhage; PRP panretinal laser photocoagulation; IVK intravitreal kenalog; VMT vitreomacular traction; MH Macular hole;  NVD neovascularization of the disc; NVE neovascularization elsewhere; AREDS age related eye disease study; ARMD age related macular degeneration; POAG primary open angle glaucoma; EBMD epithelial/anterior basement membrane dystrophy; ACIOL anterior chamber intraocular lens; IOL intraocular lens; PCIOL posterior chamber intraocular lens; Phaco/IOL  phacoemulsification with intraocular lens placement; PRK photorefractive keratectomy; LASIK laser assisted in situ keratomileusis; HTN hypertension; DM diabetes mellitus; COPD chronic obstructive pulmonary disease

## 2022-08-31 DIAGNOSIS — M5116 Intervertebral disc disorders with radiculopathy, lumbar region: Secondary | ICD-10-CM | POA: Diagnosis not present

## 2022-08-31 DIAGNOSIS — M5136 Other intervertebral disc degeneration, lumbar region: Secondary | ICD-10-CM | POA: Diagnosis not present

## 2022-08-31 DIAGNOSIS — M5126 Other intervertebral disc displacement, lumbar region: Secondary | ICD-10-CM | POA: Diagnosis not present

## 2022-08-31 DIAGNOSIS — M48062 Spinal stenosis, lumbar region with neurogenic claudication: Secondary | ICD-10-CM | POA: Diagnosis not present

## 2022-09-04 DIAGNOSIS — M48062 Spinal stenosis, lumbar region with neurogenic claudication: Secondary | ICD-10-CM | POA: Diagnosis not present

## 2022-09-04 DIAGNOSIS — M5136 Other intervertebral disc degeneration, lumbar region: Secondary | ICD-10-CM | POA: Diagnosis not present

## 2022-09-04 DIAGNOSIS — M5116 Intervertebral disc disorders with radiculopathy, lumbar region: Secondary | ICD-10-CM | POA: Diagnosis not present

## 2022-09-04 DIAGNOSIS — M5126 Other intervertebral disc displacement, lumbar region: Secondary | ICD-10-CM | POA: Diagnosis not present

## 2022-09-11 DIAGNOSIS — M5126 Other intervertebral disc displacement, lumbar region: Secondary | ICD-10-CM | POA: Diagnosis not present

## 2022-09-11 DIAGNOSIS — M48062 Spinal stenosis, lumbar region with neurogenic claudication: Secondary | ICD-10-CM | POA: Diagnosis not present

## 2022-09-11 DIAGNOSIS — M5136 Other intervertebral disc degeneration, lumbar region: Secondary | ICD-10-CM | POA: Diagnosis not present

## 2022-09-11 DIAGNOSIS — M5116 Intervertebral disc disorders with radiculopathy, lumbar region: Secondary | ICD-10-CM | POA: Diagnosis not present

## 2022-09-12 DIAGNOSIS — M25561 Pain in right knee: Secondary | ICD-10-CM | POA: Diagnosis not present

## 2022-09-12 DIAGNOSIS — M1711 Unilateral primary osteoarthritis, right knee: Secondary | ICD-10-CM | POA: Diagnosis not present

## 2022-09-13 ENCOUNTER — Ambulatory Visit (INDEPENDENT_AMBULATORY_CARE_PROVIDER_SITE_OTHER): Payer: PPO | Admitting: Ophthalmology

## 2022-09-13 ENCOUNTER — Encounter (INDEPENDENT_AMBULATORY_CARE_PROVIDER_SITE_OTHER): Payer: Self-pay | Admitting: Ophthalmology

## 2022-09-13 DIAGNOSIS — H353132 Nonexudative age-related macular degeneration, bilateral, intermediate dry stage: Secondary | ICD-10-CM | POA: Diagnosis not present

## 2022-09-13 DIAGNOSIS — I10 Essential (primary) hypertension: Secondary | ICD-10-CM | POA: Diagnosis not present

## 2022-09-13 DIAGNOSIS — H35033 Hypertensive retinopathy, bilateral: Secondary | ICD-10-CM | POA: Diagnosis not present

## 2022-09-13 DIAGNOSIS — H26493 Other secondary cataract, bilateral: Secondary | ICD-10-CM

## 2022-09-13 DIAGNOSIS — Z961 Presence of intraocular lens: Secondary | ICD-10-CM

## 2022-09-13 DIAGNOSIS — H04129 Dry eye syndrome of unspecified lacrimal gland: Secondary | ICD-10-CM | POA: Diagnosis not present

## 2022-09-14 ENCOUNTER — Encounter (INDEPENDENT_AMBULATORY_CARE_PROVIDER_SITE_OTHER): Payer: PPO | Admitting: Ophthalmology

## 2022-09-18 DIAGNOSIS — M48062 Spinal stenosis, lumbar region with neurogenic claudication: Secondary | ICD-10-CM | POA: Diagnosis not present

## 2022-09-18 DIAGNOSIS — M5126 Other intervertebral disc displacement, lumbar region: Secondary | ICD-10-CM | POA: Diagnosis not present

## 2022-09-18 DIAGNOSIS — M5136 Other intervertebral disc degeneration, lumbar region: Secondary | ICD-10-CM | POA: Diagnosis not present

## 2022-09-18 DIAGNOSIS — M5116 Intervertebral disc disorders with radiculopathy, lumbar region: Secondary | ICD-10-CM | POA: Diagnosis not present

## 2022-09-27 DIAGNOSIS — D485 Neoplasm of uncertain behavior of skin: Secondary | ICD-10-CM | POA: Diagnosis not present

## 2022-09-27 DIAGNOSIS — Z85828 Personal history of other malignant neoplasm of skin: Secondary | ICD-10-CM | POA: Diagnosis not present

## 2022-09-27 DIAGNOSIS — L578 Other skin changes due to chronic exposure to nonionizing radiation: Secondary | ICD-10-CM | POA: Diagnosis not present

## 2022-09-27 DIAGNOSIS — Z872 Personal history of diseases of the skin and subcutaneous tissue: Secondary | ICD-10-CM | POA: Diagnosis not present

## 2022-09-27 DIAGNOSIS — Q825 Congenital non-neoplastic nevus: Secondary | ICD-10-CM | POA: Diagnosis not present

## 2022-09-27 DIAGNOSIS — L821 Other seborrheic keratosis: Secondary | ICD-10-CM | POA: Diagnosis not present

## 2022-09-27 DIAGNOSIS — D2261 Melanocytic nevi of right upper limb, including shoulder: Secondary | ICD-10-CM | POA: Diagnosis not present

## 2022-09-27 DIAGNOSIS — L570 Actinic keratosis: Secondary | ICD-10-CM | POA: Diagnosis not present

## 2022-10-01 ENCOUNTER — Encounter (INDEPENDENT_AMBULATORY_CARE_PROVIDER_SITE_OTHER): Payer: PPO | Admitting: Ophthalmology

## 2022-10-01 DIAGNOSIS — I10 Essential (primary) hypertension: Secondary | ICD-10-CM

## 2022-10-01 DIAGNOSIS — M5136 Other intervertebral disc degeneration, lumbar region: Secondary | ICD-10-CM | POA: Diagnosis not present

## 2022-10-01 DIAGNOSIS — H353132 Nonexudative age-related macular degeneration, bilateral, intermediate dry stage: Secondary | ICD-10-CM

## 2022-10-01 DIAGNOSIS — M5126 Other intervertebral disc displacement, lumbar region: Secondary | ICD-10-CM | POA: Diagnosis not present

## 2022-10-01 DIAGNOSIS — H35033 Hypertensive retinopathy, bilateral: Secondary | ICD-10-CM

## 2022-10-01 DIAGNOSIS — H26493 Other secondary cataract, bilateral: Secondary | ICD-10-CM

## 2022-10-01 DIAGNOSIS — Z961 Presence of intraocular lens: Secondary | ICD-10-CM

## 2022-10-01 DIAGNOSIS — M5116 Intervertebral disc disorders with radiculopathy, lumbar region: Secondary | ICD-10-CM | POA: Diagnosis not present

## 2022-10-01 DIAGNOSIS — H04129 Dry eye syndrome of unspecified lacrimal gland: Secondary | ICD-10-CM

## 2022-10-01 DIAGNOSIS — M48062 Spinal stenosis, lumbar region with neurogenic claudication: Secondary | ICD-10-CM | POA: Diagnosis not present

## 2022-10-08 NOTE — Progress Notes (Signed)
[facility] - Clinic Note  10/09/2022     CHIEF COMPLAINT Patient presents for Retina Follow Up   HISTORY OF PRESENT ILLNESS: Kathryn Kemp is a 82 y.o. female who presents to the clinic today for:   HPI     Retina Follow Up   Patient presents with  Dry AMD.  In both eyes.  This started 4 weeks ago.        Comments   Patient here for 4 weeks retina follow up for s/p Yag OS non exu ARMD OU. Patient states vision OS had a wavy thing across it. Then had a headache above OD. Lasted 5 to 10 minutes. Has had before was told it was a migraine. Uses systane and genteal gel when eyes get real dry.      Last edited by Laddie Aquas, COA on 10/09/2022  9:06 AM.     Patient states that she has had an ocular migraines. She might have taken something for the headache. She complained of blurry vision when it happens.    Referring physician: Danella Penton, MD [phone] [facility] [address] [facility] [place],  [place] [address]  HISTORICAL INFORMATION:   Selected notes from the MEDICAL RECORD NUMBER Referred by Dr. Alben Spittle for ARMD OU/Geographic Atrophy OD LEE: 07.07.22 Ocular Hx-ARMD, Pseudophakia PMH-    CURRENT MEDICATIONS: No current outpatient medications on file. (Ophthalmic Drugs)   No current facility-administered medications for this visit. (Ophthalmic Drugs)   Current Outpatient Medications (Other)  Medication Sig   Calcium-Vitamin D 600-200 MG-UNIT tablet Take by mouth.   hydrochlorothiazide (HYDRODIURIL) 25 MG tablet Take 25 mg by mouth daily.   Multiple Vitamins-Minerals (PRESERVISION AREDS 2 PO) Take by mouth.   omeprazole (PRILOSEC) 20 MG capsule Take by mouth.   pravastatin (PRAVACHOL) 40 MG tablet Take 40 mg by mouth daily.   etodolac (LODINE) 400 MG tablet Take 400 mg by mouth every morning. (Patient not taking: Reported on 09/13/2022)   gabapentin (NEURONTIN) 100 MG capsule Take by mouth. (Patient not taking: Reported on  09/13/2022)   meloxicam (MOBIC) 7.5 MG tablet Take 7.5 mg by mouth daily. (Patient not taking: Reported on 09/13/2022)   montelukast (SINGULAIR) 10 MG tablet Take 10 mg by mouth at bedtime. (Patient not taking: Reported on 09/13/2022)   No current facility-administered medications for this visit. (Other)   REVIEW OF SYSTEMS: ROS   Positive for: Gastrointestinal, Skin, Musculoskeletal, Endocrine, Eyes Negative for: Constitutional, Neurological, Genitourinary, HENT, Cardiovascular, Respiratory, Psychiatric, Allergic/Imm, Heme/Lymph Last edited by Laddie Aquas, COA on 10/09/2022  9:06 AM.      ALLERGIES No Known Allergies  PAST MEDICAL HISTORY Past Medical History:  Diagnosis Date   Arthritis    Cancer (HCC)    skin   Esophagitis    GERD (gastroesophageal reflux disease)    Hemorrhoid    external   Herpes zoster    Hyperlipemia    Hypertension    Increased BMI    Menopause    Osteopenia    Osteopenia    Osteoporosis    Shingles    h/o breast   SUI (stress urinary incontinence, female)    Thyroid disease    Past Surgical History:  Procedure Laterality Date   CATARACT EXTRACTION     COLONOSCOPY     COLONOSCOPY WITH PROPOFOL N/A 09/18/2017   Procedure: COLONOSCOPY WITH PROPOFOL;  Surgeon: Scot Jun, MD;  Location[facility] ENDOSCOPY;  Service: Endoscopy;  Laterality: N/A;  TUBAL LIGATION     FAMILY HISTORY Family History  Problem Relation Age of Onset   Macular degeneration Mother    Lung cancer Mother    Heart disease Mother    Osteoporosis Mother    Prostate cancer Father    Liver cancer Sister    Breast cancer Neg Hx    Diabetes Neg Hx    Colon cancer Neg Hx    Ovarian cancer Neg Hx    SOCIAL HISTORY Social History   Tobacco Use   Smoking status: Never   Smokeless tobacco: Never  Vaping Use   Vaping Use: Never used  Substance Use Topics   Alcohol use: No   Drug use: No       OPHTHALMIC EXAM: Base Eye Exam     Visual Acuity (Snellen -  Linear)       Right Left   Dist [place] 20/40 +1 20/25   Dist ph  20/30 -2 NI         Tonometry (Tonopen, 9:02 AM)       Right Left   Pressure 16 15         Pupils       Dark Light Shape React APD   Right 3 2 Round Brisk None   Left 3 2 Round Brisk None         Visual Fields (Counting fingers)       Left Right    Full Full         Extraocular Movement       Right Left    Full, Ortho Full, Ortho         Neuro/Psych     Oriented x3: Yes   Mood/Affect: Normal         Dilation     Both eyes: 1.0% Mydriacyl, 2.5% Phenylephrine @ 9:02 AM           Slit Lamp and Fundus Exam     Slit Lamp Exam       Right Left   Lids/Lashes Dermatochalasis - upper lid, Meibomian gland dysfunction Dermatochalasis - upper lid, mild MGD   Conjunctiva/Sclera White and quiet White and quiet   Cornea Arcus, well healed cataract wounds Arcus, well healed cataract wounds, 1-2+ inferior PEE   Anterior Chamber Deep and quiet Deep and quiet   Iris Round and dilated Round and dilated   Lens PCIOL in good position with open PC PCIOL in good position, 1+ inferior PCO   Anterior Vitreous Vitreous syneresis, PVD Vitreous syneresis, PVD         Fundus Exam       Right Left   Disc Pink and sharp, Compact Pink and sharp, PPA   C/D Ratio 0.3 0.3   Macula Flat, Blunted foveal reflex, drusen, RPE mottling, clumping, and central atrophy, no heme or edema, no CNV, mild progression of central GA Flat, Blunted foveal reflex, drusen, RPE mottling, clumping, and early atrophy, prominent focal pigment clump temporal mac -- improved, +small PED / vitelliform like lesion inferior to fovea, No heme or edema   Vessels attenuated, Tortuous attenuated, Tortuous, AV crossing changes   Periphery Attached, +reticular degeneration; no heme, No RT/RD Attached, mild reticular degeneration, no heme           IMAGING AND PROCEDURES  Imaging and Procedures for 10/09/2022           ASSESSMENT/PLAN:    ICD-10-CM   1. Intermediate stage nonexudative age-related macular degeneration of both eyes  H35.3132 OCT, Retina - OU - Both Eyes    2. Essential hypertension  I10     3. Pseudophakia of both eyes  Z96.1     4. Dry eye  H04.129     5. Hypertensive retinopathy of both eyes  H35.033     6. PCO (posterior capsular opacification), bilateral  H26.493       1. Age related macular degeneration, non-exudative, both eyes  - +family hx of ARMD -- pt reports mother had wet AMD with significant vision loss  - pt has already been on AREDS 2 supplementation due to family history and has been monitoring her vision w/ Amsler grid provided by Dr. Alben Spittle  - pt notes focal distortion superior paracentral OD             - Recommend amsler grid monitoring  - FA 10.18.22 -- no leakage or CNV OU; mild staining of focal vitelliform like lesion OS -- inferior to fovea   - BCVA 20/30 OD - stable, 20/25 OS - stable  - OCT shows OD: Central ORA and drusen best seen on en face; OS: central SHRM/vitelliform like lesion -- stable            - f/u 6 months, DFE/OCT  2,3. Hypertensive retinopathy OU - discussed importance of tight BP control - monitor  4. Pseudophakia OU  - s/p CE/IOL OU [facility])  - IOL in good position, doing well  - monitor  5. Dry Eye Syndrome OU            - recommend artificial tears qid OU  6. PCO OU s/p YAG OU  - pt reports significant glare symptoms OU  - s/p Yag OD (12.22.23) -- good PC opening  -s/p YAG OS 06.06.24  - pt reports subjective improvement in vision OD but BCVA remains 20/30 OD - recommend YAG OS today, 06.06.24 - pt wishes to proceed with Yag Cap - RBA of procedure discussed, questions answered - informed consent obtained and signed   Ophthalmic Meds Ordered this visit:  No orders of the defined types were placed in this encounter.   This document serves as a record of services personally performed by Karie Chimera, MD,  PhD. It was created on their behalf by Gerilyn Nestle, COT an ophthalmic technician. The creation of this record is the provider's dictation and/or activities during the visit.    Electronically signed by:  Charlette Caffey, COT  10/09/22 10:14 AM  This document serves as a record of services personally performed by Karie Chimera, MD, PhD. It was created on their behalf by Gerilyn Nestle, COT an ophthalmic technician. The creation of this record is the provider's dictation and/or activities during the visit.    Electronically signed by:  Charlette Caffey, COT  10/09/22 10:15 AM   Karie Chimera, M.D., Ph.D. Diseases & Surgery of the Retina and Vitreous [facility]    Abbreviations: M myopia (nearsighted); A astigmatism; H hyperopia (farsighted); P presbyopia; Mrx spectacle prescription;  CTL contact lenses; OD right eye; OS left eye; OU both eyes  XT exotropia; ET esotropia; PEK punctate epithelial keratitis; PEE punctate epithelial erosions; DES dry eye syndrome; MGD meibomian gland dysfunction; ATs artificial tears; PFAT's preservative free artificial tears; NSC nuclear sclerotic cataract; PSC posterior subcapsular cataract; ERM epi-retinal membrane; PVD posterior vitreous detachment; RD retinal detachment; DM diabetes mellitus; DR diabetic retinopathy; NPDR non-proliferative diabetic retinopathy; PDR proliferative diabetic retinopathy; CSME clinically significant  macular edema; DME diabetic macular edema; dbh dot blot hemorrhages; CWS cotton wool spot; POAG primary open angle glaucoma; C/D cup-to-disc ratio; HVF humphrey visual field; GVF goldmann visual field; OCT optical coherence tomography; IOP intraocular pressure; BRVO Branch retinal vein occlusion; CRVO central retinal vein occlusion; CRAO central retinal artery occlusion; BRAO branch retinal artery occlusion; RT retinal tear; SB scleral buckle; PPV pars plana vitrectomy; VH Vitreous hemorrhage; PRP  panretinal laser photocoagulation; IVK intravitreal kenalog; VMT vitreomacular traction; MH Macular hole;  NVD neovascularization of the disc; NVE neovascularization elsewhere; AREDS age related eye disease study; ARMD age related macular degeneration; POAG primary open angle glaucoma; EBMD epithelial/anterior basement membrane dystrophy; ACIOL anterior chamber intraocular lens; IOL intraocular lens; PCIOL posterior chamber intraocular lens; Phaco/IOL phacoemulsification with intraocular lens placement; PRK photorefractive keratectomy; LASIK laser assisted in situ keratomileusis; HTN hypertension; DM diabetes mellitus; COPD chronic obstructive pulmonary disease

## 2022-10-09 ENCOUNTER — Ambulatory Visit (INDEPENDENT_AMBULATORY_CARE_PROVIDER_SITE_OTHER): Payer: PPO | Admitting: Ophthalmology

## 2022-10-09 ENCOUNTER — Encounter (INDEPENDENT_AMBULATORY_CARE_PROVIDER_SITE_OTHER): Payer: Self-pay | Admitting: Ophthalmology

## 2022-10-09 DIAGNOSIS — I10 Essential (primary) hypertension: Secondary | ICD-10-CM | POA: Diagnosis not present

## 2022-10-09 DIAGNOSIS — H35033 Hypertensive retinopathy, bilateral: Secondary | ICD-10-CM | POA: Diagnosis not present

## 2022-10-09 DIAGNOSIS — Z961 Presence of intraocular lens: Secondary | ICD-10-CM

## 2022-10-09 DIAGNOSIS — H353132 Nonexudative age-related macular degeneration, bilateral, intermediate dry stage: Secondary | ICD-10-CM

## 2022-10-09 DIAGNOSIS — H04129 Dry eye syndrome of unspecified lacrimal gland: Secondary | ICD-10-CM

## 2022-10-09 DIAGNOSIS — H26493 Other secondary cataract, bilateral: Secondary | ICD-10-CM

## 2022-10-10 ENCOUNTER — Encounter (INDEPENDENT_AMBULATORY_CARE_PROVIDER_SITE_OTHER): Payer: Self-pay | Admitting: Ophthalmology

## 2022-10-10 DIAGNOSIS — M5116 Intervertebral disc disorders with radiculopathy, lumbar region: Secondary | ICD-10-CM | POA: Diagnosis not present

## 2022-10-10 DIAGNOSIS — M5136 Other intervertebral disc degeneration, lumbar region: Secondary | ICD-10-CM | POA: Diagnosis not present

## 2022-10-10 DIAGNOSIS — M48062 Spinal stenosis, lumbar region with neurogenic claudication: Secondary | ICD-10-CM | POA: Diagnosis not present

## 2022-10-10 DIAGNOSIS — M5126 Other intervertebral disc displacement, lumbar region: Secondary | ICD-10-CM | POA: Diagnosis not present

## 2022-10-16 DIAGNOSIS — M48062 Spinal stenosis, lumbar region with neurogenic claudication: Secondary | ICD-10-CM | POA: Diagnosis not present

## 2022-10-16 DIAGNOSIS — M5116 Intervertebral disc disorders with radiculopathy, lumbar region: Secondary | ICD-10-CM | POA: Diagnosis not present

## 2022-10-16 DIAGNOSIS — M5126 Other intervertebral disc displacement, lumbar region: Secondary | ICD-10-CM | POA: Diagnosis not present

## 2022-10-16 DIAGNOSIS — M5136 Other intervertebral disc degeneration, lumbar region: Secondary | ICD-10-CM | POA: Diagnosis not present

## 2022-10-19 DIAGNOSIS — D2261 Melanocytic nevi of right upper limb, including shoulder: Secondary | ICD-10-CM | POA: Diagnosis not present

## 2022-10-19 DIAGNOSIS — D0361 Melanoma in situ of right upper limb, including shoulder: Secondary | ICD-10-CM | POA: Diagnosis not present

## 2022-11-05 DIAGNOSIS — M5414 Radiculopathy, thoracic region: Secondary | ICD-10-CM | POA: Diagnosis not present

## 2022-11-05 DIAGNOSIS — R300 Dysuria: Secondary | ICD-10-CM | POA: Diagnosis not present

## 2022-11-06 DIAGNOSIS — M5116 Intervertebral disc disorders with radiculopathy, lumbar region: Secondary | ICD-10-CM | POA: Diagnosis not present

## 2022-11-06 DIAGNOSIS — M48062 Spinal stenosis, lumbar region with neurogenic claudication: Secondary | ICD-10-CM | POA: Diagnosis not present

## 2022-11-06 DIAGNOSIS — M5136 Other intervertebral disc degeneration, lumbar region: Secondary | ICD-10-CM | POA: Diagnosis not present

## 2022-11-06 DIAGNOSIS — M5126 Other intervertebral disc displacement, lumbar region: Secondary | ICD-10-CM | POA: Diagnosis not present

## 2022-11-09 DIAGNOSIS — H524 Presbyopia: Secondary | ICD-10-CM | POA: Diagnosis not present

## 2022-11-09 DIAGNOSIS — Z961 Presence of intraocular lens: Secondary | ICD-10-CM | POA: Diagnosis not present

## 2022-11-09 DIAGNOSIS — H353132 Nonexudative age-related macular degeneration, bilateral, intermediate dry stage: Secondary | ICD-10-CM | POA: Diagnosis not present

## 2022-11-09 DIAGNOSIS — H04123 Dry eye syndrome of bilateral lacrimal glands: Secondary | ICD-10-CM | POA: Diagnosis not present

## 2022-11-10 ENCOUNTER — Other Ambulatory Visit: Payer: Self-pay

## 2022-11-10 ENCOUNTER — Emergency Department
Admission: EM | Admit: 2022-11-10 | Discharge: 2022-11-10 | Disposition: A | Discharge status: Home / Self Care | Payer: PPO | Attending: Emergency Medicine | Admitting: Emergency Medicine

## 2022-11-10 DIAGNOSIS — R21 Rash and other nonspecific skin eruption: Secondary | ICD-10-CM | POA: Diagnosis present

## 2022-11-10 DIAGNOSIS — R202 Paresthesia of skin: Secondary | ICD-10-CM | POA: Insufficient documentation

## 2022-11-10 DIAGNOSIS — B029 Zoster without complications: Secondary | ICD-10-CM | POA: Insufficient documentation

## 2022-11-10 LAB — COMPREHENSIVE METABOLIC PANEL
ALT: 18 U/L (ref 0–44)
AST: 20 U/L (ref 15–41)
Albumin: 3.4 g/dL — ABNORMAL LOW (ref 3.5–5.0)
Alkaline Phosphatase: 53 U/L (ref 38–126)
Anion gap: 9 (ref 5–15)
BUN: 23 mg/dL (ref 8–23)
CO2: 24 mmol/L (ref 22–32)
Calcium: 9.3 mg/dL (ref 8.9–10.3)
Chloride: 104 mmol/L (ref 98–111)
Creatinine, Ser: 0.88 mg/dL (ref 0.44–1.00)
GFR, Estimated: >60 mL/min (ref >60)
Glucose, Bld: 185 mg/dL — ABNORMAL HIGH (ref 70–99)
Potassium: 3.7 mmol/L (ref 3.5–5.1)
Sodium: 137 mmol/L (ref 135–145)
Total Bilirubin: 0.6 mg/dL (ref 0.3–1.2)
Total Protein: 6.8 g/dL (ref 6.5–8.1)

## 2022-11-10 LAB — CBC WITH DIFFERENTIAL/PLATELET
Abs Immature Granulocytes: 0.11 10*3/uL — ABNORMAL HIGH (ref 0.00–0.07)
Basophils Absolute: 0.1 10*3/uL (ref 0.0–0.1)
Basophils Relative: 1 %
Eosinophils Absolute: 0.2 10*3/uL (ref 0.0–0.5)
Eosinophils Relative: 2 %
HCT: 35.5 % — ABNORMAL LOW (ref 36.0–46.0)
Hemoglobin: 11.4 g/dL — ABNORMAL LOW (ref 12.0–15.0)
Immature Granulocytes: 1 %
Lymphocytes Relative: 24 %
Lymphs Abs: 2.1 10*3/uL (ref 0.7–4.0)
MCH: 26.1 pg (ref 26.0–34.0)
MCHC: 32.1 g/dL (ref 30.0–36.0)
MCV: 81.2 fL (ref 80.0–100.0)
Monocytes Absolute: 0.6 10*3/uL (ref 0.1–1.0)
Monocytes Relative: 7 %
Neutro Abs: 5.6 10*3/uL (ref 1.7–7.7)
Neutrophils Relative %: 65 %
Platelets: 258 10*3/uL (ref 150–400)
RBC: 4.37 MIL/uL (ref 3.87–5.11)
RDW: 12.8 % (ref 11.5–15.5)
WBC: 8.6 10*3/uL (ref 4.0–10.5)
nRBC: 0 % (ref 0.0–0.2)

## 2022-11-10 MED ORDER — VALACYCLOVIR HCL 1 G PO TABS: 1000.0000 mg | ORAL_TABLET | Freq: Two times a day (BID) | ORAL | 0 refills | Status: AC

## 2022-11-10 NOTE — ED Provider Notes (Signed)
[facility] Provider Note    Event Date/Time   First MD Initiated Contact with Patient 11/10/22 1301     (approximate)   History   Possible Shingles   HPI  Kathryn Kemp is a 82 y.o. female with history of shingles, osteoporosis, melanoma presents emergency department with concerns of tingling sensation along the left ribs with stinging pain.  Was seen by her PCP because he was in a rash they started her on a steroid pack.  States symptoms have continued.  Thinks that she has shingles hemostat and had an outbreak.  However patient has had 2 shingles vaccines.  No fever or chills.  No abdominal pain.  No blood in her urine.      Physical Exam   Triage Vital Signs: ED Triage Vitals  Encounter Vitals Group     BP 11/10/22 1239 (!) 150/70     Systolic BP Percentile --      Diastolic BP Percentile --      Pulse Rate 11/10/22 1239 (!) 114     Resp 11/10/22 1239 16     Temp 11/10/22 1239 97.6 F (36.4 C)     Temp src --      SpO2 11/10/22 1239 97 %     Weight 11/10/22 1239 140 lb (63.5 kg)     Height 11/10/22 1239 4\' 11"  (1.499 m)     Head Circumference --      Peak Flow --      Pain Score 11/10/22 1244 7     Pain Loc --      Pain Education --      Exclude from Growth Chart --     Most recent vital signs: Vitals:   11/10/22 1239  BP: (!) 150/70  Pulse: (!) 114  Resp: 16  Temp: 97.6 F (36.4 C)  SpO2: 97%     General: Awake, no distress.   CV:  Good peripheral perfusion. regular rate and  rhythm Resp:  Normal effort. Lungs cta Abd:  No distention.   Other:  Skin with a couple of pink raised areas along the scapula in the area of discomfort, no vesicles noted   ED Results / Procedures / Treatments   Labs (all labs ordered are listed, but only abnormal results are displayed) Labs Reviewed  CBC WITH DIFFERENTIAL/PLATELET - Abnormal; Notable for the following components:      Result Value   Hemoglobin 11.4 (*)    HCT 35.5 (*)    Abs  Immature Granulocytes 0.11 (*)    All other components within normal limits  COMPREHENSIVE METABOLIC PANEL - Abnormal; Notable for the following components:   Glucose, Bld 185 (*)    Albumin 3.4 (*)    All other components within normal limits     EKG     RADIOLOGY     PROCEDURES:   Procedures   MEDICATIONS ORDERED IN ED: Medications - No data to display   IMPRESSION / MDM / ASSESSMENT AND PLAN / ED COURSE  I reviewed the triage vital signs and the nursing notes.                              Differential diagnosis includes, but is not limited to, shingles, herpetic neuropathy, thoracic radiculopathy  Patient's presentation is most consistent with Patient's presentation is most consistent with   Patient's labs are reassuring  Due to the patient's symptoms and her  having had a vaccine I do not feel that she would have a typical shingles rash.  Patient is not having a lot of pain but is more concerned about stinging and tingling.  Will go ahead and start her on Valtrex.  She is to follow-up with her regular doctor if not improving in 3 days.  Return emergency department if worsening.  She is in agreement with this treatment plan.  Discharged in stable condition.       FINAL CLINICAL IMPRESSION(S) / ED DIAGNOSES   Final diagnoses:  Herpes zoster without complication     Rx / DC Orders   ED Discharge Orders          Ordered    valACYclovir (VALTREX) 1000 MG tablet  2 times daily        11/10/22 1337             Note:  This document was prepared using Dragon voice recognition software and may include unintentional dictation errors.    Faythe Ghee, PA-C 11/10/22 1552    Loleta Rose, MD 11/10/22 [phone]

## 2022-11-10 NOTE — Discharge Instructions (Signed)
Use medication as prescribed.  Also take your gabapentin.  Return emergency department or see your regular doctor if not improving in 4 to 5 days.  Return if worsening

## 2022-11-10 NOTE — ED Triage Notes (Signed)
Pt presents to ED with c/o of L sided lateral pain. Pt states HX of shingles but endorses no rash and had just finished recent 5 day dose of steroids. NAD noted.

## 2022-11-15 ENCOUNTER — Telehealth: Payer: Self-pay

## 2022-11-15 NOTE — Telephone Encounter (Signed)
Transition Care Management Follow-up Telephone Call Date of discharge and from where: 11/10/2022 [facility] How have you been since you were released from the hospital? Patient stated she is feels wonderful and was very pleased with the care she received. Any questions or concerns? No  Items Reviewed: Did the pt receive and understand the discharge instructions provided? Yes  Medications obtained and verified? Yes  Other? No  Any new allergies since your discharge? No  Dietary orders reviewed? Yes Do you have support at home? Yes   Follow up appointments reviewed:  PCP Hospital f/u appt confirmed?  Patient stated she will follow-up as needed.  Scheduled to see  on  @ . Specialist Hospital f/u appt confirmed? No  Scheduled to see  on  @ . Are transportation arrangements needed? No  If their condition worsens, is the pt aware to call PCP or go to the Emergency Dept.? Yes Was the patient provided with contact information for the PCP's office or ED? Yes Was to pt encouraged to call back with questions or concerns? Yes  Malvika Tung [facility]  [facility] Population Health Community Resource Care Guide   [email or web address]  ?? [phone]   Website: [email or web address]  [email or web address]

## 2022-11-27 DIAGNOSIS — M7918 Myalgia, other site: Secondary | ICD-10-CM | POA: Diagnosis not present

## 2022-11-27 DIAGNOSIS — I10 Essential (primary) hypertension: Secondary | ICD-10-CM | POA: Diagnosis not present

## 2022-11-27 DIAGNOSIS — B0223 Postherpetic polyneuropathy: Secondary | ICD-10-CM | POA: Diagnosis not present

## 2022-11-27 DIAGNOSIS — C4361 Malignant melanoma of right upper limb, including shoulder: Secondary | ICD-10-CM | POA: Diagnosis not present

## 2022-11-27 DIAGNOSIS — M5414 Radiculopathy, thoracic region: Secondary | ICD-10-CM | POA: Diagnosis not present

## 2023-01-03 DIAGNOSIS — M5414 Radiculopathy, thoracic region: Secondary | ICD-10-CM | POA: Diagnosis not present

## 2023-01-03 DIAGNOSIS — Z23 Encounter for immunization: Secondary | ICD-10-CM | POA: Diagnosis not present

## 2023-01-03 DIAGNOSIS — R1084 Generalized abdominal pain: Secondary | ICD-10-CM | POA: Diagnosis not present

## 2023-01-04 DIAGNOSIS — M5414 Radiculopathy, thoracic region: Secondary | ICD-10-CM | POA: Diagnosis not present

## 2023-01-07 ENCOUNTER — Other Ambulatory Visit: Payer: Self-pay | Admitting: Internal Medicine

## 2023-01-07 DIAGNOSIS — R1084 Generalized abdominal pain: Secondary | ICD-10-CM

## 2023-01-07 DIAGNOSIS — M5414 Radiculopathy, thoracic region: Secondary | ICD-10-CM

## 2023-01-15 ENCOUNTER — Ambulatory Visit
Admission: RE | Admit: 2023-01-15 | Discharge: 2023-01-15 | Disposition: A | Discharge status: Home / Self Care | Payer: PPO | Source: Ambulatory Visit | Attending: Internal Medicine | Admitting: Internal Medicine

## 2023-01-15 DIAGNOSIS — K449 Diaphragmatic hernia without obstruction or gangrene: Secondary | ICD-10-CM | POA: Diagnosis not present

## 2023-01-15 DIAGNOSIS — R1084 Generalized abdominal pain: Secondary | ICD-10-CM | POA: Diagnosis not present

## 2023-01-15 DIAGNOSIS — R109 Unspecified abdominal pain: Secondary | ICD-10-CM | POA: Diagnosis not present

## 2023-01-15 DIAGNOSIS — M5414 Radiculopathy, thoracic region: Secondary | ICD-10-CM | POA: Insufficient documentation

## 2023-01-15 DIAGNOSIS — N281 Cyst of kidney, acquired: Secondary | ICD-10-CM | POA: Diagnosis not present

## 2023-01-15 MED ORDER — IOHEXOL 300 MG/ML  SOLN
100.0000 mL | Freq: Once | INTRAMUSCULAR | Status: AC | PRN
  Administered 2023-01-15: 100 mL via INTRAVENOUS

## 2023-01-17 DIAGNOSIS — K2100 Gastro-esophageal reflux disease with esophagitis, without bleeding: Secondary | ICD-10-CM | POA: Diagnosis not present

## 2023-01-24 DIAGNOSIS — K449 Diaphragmatic hernia without obstruction or gangrene: Secondary | ICD-10-CM | POA: Diagnosis not present

## 2023-01-24 DIAGNOSIS — R1310 Dysphagia, unspecified: Secondary | ICD-10-CM | POA: Diagnosis not present

## 2023-01-24 DIAGNOSIS — K219 Gastro-esophageal reflux disease without esophagitis: Secondary | ICD-10-CM | POA: Diagnosis not present

## 2023-01-30 ENCOUNTER — Other Ambulatory Visit: Payer: Self-pay | Admitting: Nurse Practitioner

## 2023-01-30 DIAGNOSIS — K219 Gastro-esophageal reflux disease without esophagitis: Secondary | ICD-10-CM

## 2023-01-30 DIAGNOSIS — R1310 Dysphagia, unspecified: Secondary | ICD-10-CM

## 2023-01-30 DIAGNOSIS — K449 Diaphragmatic hernia without obstruction or gangrene: Secondary | ICD-10-CM

## 2023-02-06 ENCOUNTER — Ambulatory Visit
Admission: RE | Admit: 2023-02-06 | Discharge: 2023-02-06 | Disposition: A | Discharge status: Home / Self Care | Payer: PPO | Source: Ambulatory Visit | Attending: Nurse Practitioner | Admitting: Nurse Practitioner

## 2023-02-06 ENCOUNTER — Encounter: Payer: Self-pay | Admitting: Gastroenterology

## 2023-02-06 DIAGNOSIS — K219 Gastro-esophageal reflux disease without esophagitis: Secondary | ICD-10-CM | POA: Diagnosis not present

## 2023-02-06 DIAGNOSIS — K449 Diaphragmatic hernia without obstruction or gangrene: Secondary | ICD-10-CM | POA: Insufficient documentation

## 2023-02-06 DIAGNOSIS — R1310 Dysphagia, unspecified: Secondary | ICD-10-CM | POA: Diagnosis not present

## 2023-03-29 ENCOUNTER — Encounter: Payer: Self-pay | Admitting: Gastroenterology

## 2023-04-09 ENCOUNTER — Encounter (INDEPENDENT_AMBULATORY_CARE_PROVIDER_SITE_OTHER): Payer: PPO | Admitting: Ophthalmology

## 2023-04-09 DIAGNOSIS — H04129 Dry eye syndrome of unspecified lacrimal gland: Secondary | ICD-10-CM

## 2023-04-09 DIAGNOSIS — I10 Essential (primary) hypertension: Secondary | ICD-10-CM

## 2023-04-09 DIAGNOSIS — Z961 Presence of intraocular lens: Secondary | ICD-10-CM

## 2023-04-09 DIAGNOSIS — H353132 Nonexudative age-related macular degeneration, bilateral, intermediate dry stage: Secondary | ICD-10-CM

## 2023-04-09 DIAGNOSIS — H26493 Other secondary cataract, bilateral: Secondary | ICD-10-CM

## 2023-04-09 DIAGNOSIS — H35033 Hypertensive retinopathy, bilateral: Secondary | ICD-10-CM

## 2023-04-11 ENCOUNTER — Encounter: Payer: Self-pay | Admitting: Gastroenterology

## 2023-04-18 ENCOUNTER — Other Ambulatory Visit: Payer: Self-pay | Admitting: Internal Medicine

## 2023-04-18 DIAGNOSIS — Z1231 Encounter for screening mammogram for malignant neoplasm of breast: Secondary | ICD-10-CM

## 2023-04-19 ENCOUNTER — Encounter: Payer: Self-pay | Admitting: Gastroenterology

## 2023-04-22 ENCOUNTER — Encounter: Payer: Self-pay | Admitting: Gastroenterology

## 2023-04-22 ENCOUNTER — Encounter
Admission: RE | Disposition: A | Discharge status: Home / Self Care | Payer: Self-pay | Source: Home / Self Care | Attending: Gastroenterology

## 2023-04-22 ENCOUNTER — Ambulatory Visit: Payer: PPO | Admitting: Certified Registered"

## 2023-04-22 ENCOUNTER — Ambulatory Visit
Admission: RE | Admit: 2023-04-22 | Discharge: 2023-04-22 | Disposition: A | Discharge status: Home / Self Care | Payer: PPO | Attending: Gastroenterology | Admitting: Gastroenterology

## 2023-04-22 DIAGNOSIS — Z87891 Personal history of nicotine dependence: Secondary | ICD-10-CM | POA: Diagnosis not present

## 2023-04-22 DIAGNOSIS — K2100 Gastro-esophageal reflux disease with esophagitis, without bleeding: Secondary | ICD-10-CM | POA: Diagnosis not present

## 2023-04-22 DIAGNOSIS — K224 Dyskinesia of esophagus: Secondary | ICD-10-CM | POA: Insufficient documentation

## 2023-04-22 DIAGNOSIS — I10 Essential (primary) hypertension: Secondary | ICD-10-CM | POA: Diagnosis not present

## 2023-04-22 DIAGNOSIS — K449 Diaphragmatic hernia without obstruction or gangrene: Secondary | ICD-10-CM | POA: Diagnosis not present

## 2023-04-22 DIAGNOSIS — R1310 Dysphagia, unspecified: Secondary | ICD-10-CM | POA: Diagnosis not present

## 2023-04-22 DIAGNOSIS — Z79899 Other long term (current) drug therapy: Secondary | ICD-10-CM | POA: Insufficient documentation

## 2023-04-22 HISTORY — DX: Unspecified osteoarthritis, unspecified site: M19.90

## 2023-04-22 HISTORY — DX: Contact with and (suspected) exposure to asbestos: Z77.090

## 2023-04-22 HISTORY — PX: ESOPHAGOGASTRODUODENOSCOPY (EGD) WITH PROPOFOL: SHX5813

## 2023-04-22 HISTORY — DX: Varicose veins of bilateral lower extremities with other complications: I83.893

## 2023-04-22 HISTORY — DX: Hypothyroidism, unspecified: E03.9

## 2023-04-22 HISTORY — PX: MALONEY DILATION: SHX5535

## 2023-04-22 HISTORY — DX: Malignant melanoma of skin, unspecified: C43.9

## 2023-04-22 HISTORY — DX: Spondylosis without myelopathy or radiculopathy, lumbar region: M47.816

## 2023-04-22 SURGERY — ESOPHAGOGASTRODUODENOSCOPY (EGD) WITH PROPOFOL
Anesthesia: General

## 2023-04-22 MED ORDER — SODIUM CHLORIDE 0.9 % IV SOLN: INTRAVENOUS | Status: DC

## 2023-04-22 MED ORDER — LIDOCAINE HCL (PF) 2 % IJ SOLN
INTRAMUSCULAR | Status: AC
  Filled 2023-04-22: qty 5

## 2023-04-22 MED ORDER — PROPOFOL 10 MG/ML IV BOLUS
INTRAVENOUS | Status: AC
  Filled 2023-04-22: qty 20

## 2023-04-22 MED ORDER — PROPOFOL 500 MG/50ML IV EMUL
INTRAVENOUS | Status: DC | PRN
  Administered 2023-04-22: 20 mg via INTRAVENOUS
  Administered 2023-04-22: 90 mg via INTRAVENOUS
  Administered 2023-04-22 (×3): 20 mg via INTRAVENOUS

## 2023-04-22 MED ORDER — LIDOCAINE HCL (CARDIAC) PF 100 MG/5ML IV SOSY
PREFILLED_SYRINGE | INTRAVENOUS | Status: DC | PRN
  Administered 2023-04-22: 100 mg via INTRAVENOUS

## 2023-04-22 NOTE — Anesthesia Preprocedure Evaluation (Signed)
 Anesthesia Evaluation  Patient identified by MRN, date of birth, ID band Patient awake    Reviewed: Allergy & Precautions, H&P , NPO status , Patient's Chart, lab work & pertinent test results, reviewed documented beta blocker date and time   Airway Mallampati: II   Neck ROM: full    Dental  (+) Poor Dentition   Pulmonary neg pulmonary ROS, former smoker   Pulmonary exam normal        Cardiovascular Exercise Tolerance: Poor hypertension, On Medications negative cardio ROS Normal cardiovascular exam Rhythm:regular Rate:Normal     Neuro/Psych negative neurological ROS  negative psych ROS   GI/Hepatic Neg liver ROS,GERD  Medicated,,  Endo/Other  Hypothyroidism    Renal/GU negative Renal ROS  negative genitourinary   Musculoskeletal   Abdominal   Peds  Hematology negative hematology ROS (+)   Anesthesia Other Findings Past Medical History: No date: Arthritis No date: Cancer (HCC)     Comment:  skin No date: Esophagitis No date: GERD (gastroesophageal reflux disease) No date: H/O asbestos exposure No date: Hemorrhoid     Comment:  external No date: Herpes zoster No date: Hyperlipemia No date: Hypertension No date: Hypothyroidism No date: Increased BMI No date: Lumbar spondylosis No date: Melanoma (HCC)     Comment:  RIGHT UPPER ARM No date: Menopause No date: Osteoarthritis No date: Osteopenia No date: Osteoporosis No date: Shingles     Comment:  h/o breast No date: SUI (stress urinary incontinence, female) No date: Symptomatic varicose veins of both lower extremities No date: Thyroid  disease Past Surgical History: No date: CATARACT EXTRACTION No date: CATARACT EXTRACTION No date: COLONOSCOPY 09/18/2017: COLONOSCOPY WITH PROPOFOL ; N/A     Comment:  Procedure: COLONOSCOPY WITH PROPOFOL ;  Surgeon: Viktoria Lamar DASEN, MD;  Location: [facility] ENDOSCOPY;  Service:               Endoscopy;   Laterality: N/A; No date: ESOPHAGOGASTRODUODENOSCOPY No date: EYE SURGERY No date: TUBAL LIGATION BMI    Body Mass Index: 27.79 kg/m     Reproductive/Obstetrics negative OB ROS                             Anesthesia Physical Anesthesia Plan  ASA: 2  Anesthesia Plan: General   Post-op Pain Management:    Induction:   PONV Risk Score and Plan:   Airway Management Planned:   Additional Equipment:   Intra-op Plan:   Post-operative Plan:   Informed Consent: I have reviewed the patients History and Physical, chart, labs and discussed the procedure including the risks, benefits and alternatives for the proposed anesthesia with the patient or authorized representative who has indicated his/her understanding and acceptance.     Dental Advisory Given  Plan Discussed with: CRNA  Anesthesia Plan Comments:        Anesthesia Quick Evaluation

## 2023-04-22 NOTE — Op Note (Signed)
 [facility] Gastroenterology Patient Name: Kathryn Kemp Procedure Date: 04/22/2023 10:30 AM MRN: [id number] Account #: [id number] Date of Birth: 10-Jul-1940 Admit Type: Outpatient Age: 83 Room: [facility] ENDO ROOM 2 Gender: Female Note Status: Finalized Instrument Name: Donnita [id number] Procedure:             Upper GI endoscopy Indications:           Dysphagia Providers:             Elspeth Ozell Jungling DO, DO Medicines:             Monitored Anesthesia Care Complications:         No immediate complications. Estimated blood loss: None. Procedure:             Pre-Anesthesia Assessment:                        - Prior to the procedure, a History and Physical was                         performed, and patient medications and allergies were                         reviewed. The patient is competent. The risks and                         benefits of the procedure and the sedation options and                         risks were discussed with the patient. All questions                         were answered and informed consent was obtained.                         Patient identification and proposed procedure were                         verified by the physician, the nurse, the anesthetist                         and the technician in the endoscopy suite. Mental                         Status Examination: alert and oriented. Airway                         Examination: normal oropharyngeal airway and neck                         mobility. Respiratory Examination: clear to                         auscultation. CV Examination: RRR, no murmurs, no S3                         or S4. Prophylactic Antibiotics: The patient does not                         require prophylactic  antibiotics. Prior                         Anticoagulants: The patient has taken no anticoagulant                         or antiplatelet agents. ASA Grade Assessment: III - A                         patient  with severe systemic disease. After reviewing                         the risks and benefits, the patient was deemed in                         satisfactory condition to undergo the procedure. The                         anesthesia plan was to use monitored anesthesia care                         (MAC). Immediately prior to administration of                         medications, the patient was re-assessed for adequacy                         to receive sedatives. The heart rate, respiratory                         rate, oxygen saturations, blood pressure, adequacy of                         pulmonary ventilation, and response to care were                         monitored throughout the procedure. The physical                         status of the patient was re-assessed after the                         procedure.                        After obtaining informed consent, the endoscope was                         passed under direct vision. Throughout the procedure,                         the patient's blood pressure, pulse, and oxygen                         saturations were monitored continuously. The Endoscope                         was introduced through the mouth, and advanced to the  second part of duodenum. The upper GI endoscopy was                         accomplished without difficulty. The patient tolerated                         the procedure well. Findings:      The duodenal bulb, first portion of the duodenum, second portion of the       duodenum and third portion of the duodenum were normal. Estimated blood       loss: none.      A small hiatal hernia was present. Estimated blood loss: none.      The exam of the stomach was otherwise normal.      The Z-line was regular. Estimated blood loss: none.      Esophagogastric landmarks were identified: the gastroesophageal junction       was found at 35 cm from the incisors.      Abnormal motility was  noted in the esophagus. The cricopharyngeus was       normal. There is spasticity of the esophageal body. The distal       esophagus/lower esophageal sphincter is open. Estimated blood loss: none.      No endoscopic abnormality was evident in the esophagus to explain the       patient's complaint of dysphagia. It was decided, however, to proceed       with dilation of the entire esophagus. The scope was withdrawn. Dilation       was performed with a Maloney dilator with mild resistance at 48 Fr. The       dilation site was examined following endoscope reinsertion and showed no       change. Estimated blood loss: none. The scope was withdrawn. Dilation       was performed with a Maloney dilator with the dilator could not be       passed at 52 Fr. The dilation site was examined following endoscope       reinsertion and showed no change. Estimated blood loss: none. Impression:            - Normal duodenal bulb, first portion of the duodenum,                         second portion of the duodenum and third portion of                         the duodenum.                        - Small hiatal hernia.                        - Z-line regular.                        - Esophagogastric landmarks identified.                        - Abnormal esophageal motility, suspicious for                         presbyesophagus.                        -  No endoscopic esophageal abnormality to explain                         patient's dysphagia. Esophagus dilated. Dilated.                        - No specimens collected. Recommendation:        - Patient has a contact number available for                         emergencies. The signs and symptoms of potential                         delayed complications were discussed with the patient.                         Return to normal activities tomorrow. Written                         discharge instructions were provided to the patient.                        -  Discharge patient to home.                        - Soft diet today.                        - Continue present medications.                        - Repeat upper endoscopy PRN for retreatment.                        - Return to GI office as previously scheduled.                        - The findings and recommendations were discussed with                         the patient. Procedure Code(s):     --- Professional ---                        [phone], Esophagogastroduodenoscopy, flexible,                         transoral; diagnostic, including collection of                         specimen(s) by brushing or washing, when performed                         (separate procedure)                        43450, Dilation of esophagus, by unguided sound or                         bougie, single or multiple passes Diagnosis Code(s):     --- Professional ---  K44.9, Diaphragmatic hernia without obstruction or                         gangrene                        K22.4, Dyskinesia of esophagus                        R13.10, Dysphagia, unspecified CPT copyright 2022 American Medical Association. All rights reserved. The codes documented in this report are preliminary and upon coder review may  be revised to meet current compliance requirements. Attending Participation:      I personally performed the entire procedure. Elspeth Jungling, DO Elspeth Ozell Jungling DO, DO 04/22/2023 10:55:28 AM This report has been signed electronically. Number of Addenda: 0 Note Initiated On: 04/22/2023 10:30 AM Estimated Blood Loss:  Estimated blood loss: none.      [facility]

## 2023-04-22 NOTE — H&P (Signed)
 Pre-Procedure H&P   Patient ID: Kathryn Kemp is a 83 y.o. female.  Gastroenterology Provider: Elspeth Ozell Jungling, DO  Referring Provider: Luke Barefoot, NP PCP: Cleotilde Oneil FALCON, MD  Date: 04/22/2023  HPI Ms. Kathryn Kemp is a 83 y.o. female who presents today for Esophagogastroduodenoscopy for gerd, dysphagia, hiatal hernia .  Patient experiencing solid food and pill dysphagia with suprasternal and mid sternal sticking.  She did undergo a CT after epigastric and thoracic pain which noted a small hiatal hernia.  She also underwent a esophagram with moderate dysmotility and tertiary contractions.  There is a small cervical web.  Barium tablet passes with ease.  No nausea vomiting or regurgitation.  Weight and appetite have been stable.  Some of the symptoms improved with increased reflux medication. No melena or hematochezia No family history of GI disease or malignancy  Last EGD and colonoscopy in March 2014 demonstrating hiatal hernia and internal hemorrhoids   Past Medical History:  Diagnosis Date   Arthritis    Cancer (HCC)    skin   Esophagitis    GERD (gastroesophageal reflux disease)    H/O asbestos exposure    Hemorrhoid    external   Herpes zoster    Hyperlipemia    Hypertension    Hypothyroidism    Increased BMI    Lumbar spondylosis    Melanoma (HCC)    RIGHT UPPER ARM   Menopause    Osteoarthritis    Osteopenia    Osteoporosis    Shingles    h/o breast   SUI (stress urinary incontinence, female)    Symptomatic varicose veins of both lower extremities    Thyroid  disease     Past Surgical History:  Procedure Laterality Date   CATARACT EXTRACTION     CATARACT EXTRACTION     COLONOSCOPY     COLONOSCOPY WITH PROPOFOL  N/A 09/18/2017   Procedure: COLONOSCOPY WITH PROPOFOL ;  Surgeon: Viktoria Lamar DASEN, MD;  Location[facility] ENDOSCOPY;  Service: Endoscopy;  Laterality: N/A;   ESOPHAGOGASTRODUODENOSCOPY     EYE SURGERY     TUBAL LIGATION      Family  History No h/o GI disease or malignancy  Review of Systems  Constitutional:  Negative for activity change, appetite change, chills, diaphoresis, fatigue, fever and unexpected weight change.  HENT:  Positive for trouble swallowing. Negative for voice change.   Respiratory:  Negative for shortness of breath and wheezing.   Cardiovascular:  Negative for chest pain, palpitations and leg swelling.  Gastrointestinal:  Negative for abdominal distention, abdominal pain, anal bleeding, blood in stool, constipation, diarrhea, nausea, rectal pain and vomiting.  Musculoskeletal:  Negative for arthralgias and myalgias.  Skin:  Negative for color change and pallor.  Neurological:  Negative for dizziness, syncope and weakness.  Psychiatric/Behavioral:  Negative for confusion.   All other systems reviewed and are negative.    Medications No current facility-administered medications on file prior to encounter.   Current Outpatient Medications on File Prior to Encounter  Medication Sig Dispense Refill   cholecalciferol (VITAMIN D3) 10 MCG/ML LIQD oral liquid Take 400 Units by mouth daily.     hydrochlorothiazide (HYDRODIURIL) 25 MG tablet Take 25 mg by mouth daily.     magnesium oxide (MAG-OX) 400 (240 Mg) MG tablet Take 400 mg by mouth daily.     Multiple Vitamins-Minerals (PRESERVISION AREDS 2 PO) Take by mouth.     omeprazole (PRILOSEC) 20 MG capsule Take by mouth.     pravastatin (  PRAVACHOL) 40 MG tablet Take 40 mg by mouth daily.     Calcium-Vitamin D 600-200 MG-UNIT tablet Take by mouth.     etodolac (LODINE) 400 MG tablet Take 400 mg by mouth every morning. (Patient not taking: Reported on 09/13/2022)     gabapentin (NEURONTIN) 100 MG capsule Take by mouth. (Patient not taking: Reported on 09/13/2022)     meloxicam (MOBIC) 7.5 MG tablet Take 7.5 mg by mouth daily. (Patient not taking: Reported on 09/13/2022)     montelukast (SINGULAIR) 10 MG tablet Take 10 mg by mouth at bedtime. (Patient not taking:  Reported on 09/13/2022)      Pertinent medications related to GI and procedure were reviewed by me with the patient prior to the procedure   Current Facility-Administered Medications:    0.9 %  sodium chloride  infusion, , Intravenous, Continuous, Onita Elspeth Sharper, DO, Last Rate: 20 mL/hr at 04/22/23 1015, Continued from Pre-op at 04/22/23 1015  sodium chloride  20 mL/hr at 04/22/23 1015       No Known Allergies Allergies were reviewed by me prior to the procedure  Objective   Body mass index is 27.79 kg/m. Vitals:   04/22/23 0941 04/22/23 1000  BP: (!) 153/74 137/65  Pulse: 94   Resp: 16   Temp: (!) 96.7 F (35.9 C)   TempSrc: Temporal   SpO2: 95%   Weight: 62.4 kg   Height: 4' 11 (1.499 m)      Physical Exam Vitals and nursing note reviewed.  Constitutional:      General: She is not in acute distress.    Appearance: Normal appearance. She is not ill-appearing, toxic-appearing or diaphoretic.  HENT:     Head: Normocephalic and atraumatic.     Nose: Nose normal.     Mouth/Throat:     Mouth: Mucous membranes are moist.     Pharynx: Oropharynx is clear.  Eyes:     General: No scleral icterus.    Extraocular Movements: Extraocular movements intact.  Cardiovascular:     Rate and Rhythm: Normal rate and regular rhythm.     Heart sounds: Normal heart sounds. No murmur heard.    No friction rub. No gallop.  Pulmonary:     Effort: Pulmonary effort is normal. No respiratory distress.     Breath sounds: Normal breath sounds. No wheezing, rhonchi or rales.  Abdominal:     General: Bowel sounds are normal. There is no distension.     Palpations: Abdomen is soft.     Tenderness: There is no abdominal tenderness. There is no guarding or rebound.  Musculoskeletal:     Cervical back: Neck supple.     Right lower leg: No edema.     Left lower leg: No edema.  Skin:    General: Skin is warm and dry.     Coloration: Skin is not jaundiced or pale.  Neurological:      General: No focal deficit present.     Mental Status: She is alert and oriented to person, place, and time. Mental status is at baseline.  Psychiatric:        Mood and Affect: Mood normal.        Behavior: Behavior normal.        Thought Content: Thought content normal.        Judgment: Judgment normal.      Assessment:  Ms. Kathryn Kemp is a 83 y.o. female  who presents today for Esophagogastroduodenoscopy for gerd, dysphagia, hiatal hernia .  Plan:  Esophagogastroduodenoscopy with possible intervention today  Esophagogastroduodenoscopy with possible biopsy, control of bleeding, polypectomy, and interventions as necessary has been discussed with the patient/patient representative. Informed consent was obtained from the patient/patient representative after explaining the indication, nature, and risks of the procedure including but not limited to death, bleeding, perforation, missed neoplasm/lesions, cardiorespiratory compromise, and reaction to medications. Opportunity for questions was given and appropriate answers were provided. Patient/patient representative has verbalized understanding is amenable to undergoing the procedure.   Elspeth Ozell Jungling, DO  [facility] Gastroenterology  Portions of the record may have been created with voice recognition software. Occasional wrong-word or 'sound-a-like' substitutions may have occurred due to the inherent limitations of voice recognition software.  Read the chart carefully and recognize, using context, where substitutions may have occurred.

## 2023-04-22 NOTE — Transfer of Care (Signed)
 Immediate Anesthesia Transfer of Care Note  Patient: Kathryn Kemp  Procedure(s) Performed: ESOPHAGOGASTRODUODENOSCOPY (EGD) WITH PROPOFOL  MALONEY DILATION  Patient Location: Endoscopy Unit  Anesthesia Type:General  Level of Consciousness: awake, alert , and drowsy  Airway & Oxygen Therapy: Patient Spontanous Breathing  Post-op Assessment: Report given to RN and Post -op Vital signs reviewed and stable  Post vital signs: Reviewed and stable  Last Vitals:  Vitals Value Taken Time  BP 120/47 04/22/23 1054  Temp 35.9 1054  Pulse 89 04/22/23 1054  Resp 15 04/22/23 1054  SpO2 96 % 04/22/23 1054  Vitals shown include unfiled device data.  Last Pain:  Vitals:   04/22/23 0941  TempSrc: Temporal  PainSc: 0-No pain         Complications: No notable events documented.

## 2023-04-22 NOTE — Interval H&P Note (Signed)
 History and Physical Interval Note: Preprocedure H&P from 04/22/23  was reviewed and there was no interval change after seeing and examining the patient.  Written consent was obtained from the patient after discussion of risks, benefits, and alternatives. Patient has consented to proceed with Esophagogastroduodenoscopy with possible intervention   04/22/2023 10:34 AM  Kathryn Kemp  has presented today for surgery, with the diagnosis of 530.81 (ICD-9-CM) - K21.9 (ICD-10-CM) - Gastroesophageal reflux disease, unspecified whether esophagitis present787.20 (ICD-9-CM) - R13.10 (ICD-10-CM) - Dysphagia, unspecified type553.3 (ICD-9-CM) - K44.9 (ICD-10-CM) - Hiatal hernia.  The various methods of treatment have been discussed with the patient and family. After consideration of risks, benefits and other options for treatment, the patient has consented to  Procedure(s): ESOPHAGOGASTRODUODENOSCOPY (EGD) WITH PROPOFOL  (N/A) as a surgical intervention.  The patient's history has been reviewed, patient examined, no change in status, stable for surgery.  I have reviewed the patient's chart and labs.  Questions were answered to the patient's satisfaction.     Elspeth Ozell Jungling

## 2023-04-29 NOTE — Anesthesia Postprocedure Evaluation (Signed)
Anesthesia Post Note  Patient: EMBERLIE WALTRIP  Procedure(s) Performed: ESOPHAGOGASTRODUODENOSCOPY (EGD) WITH PROPOFOL MALONEY DILATION  Patient location during evaluation: PACU Anesthesia Type: General Level of consciousness: awake and alert Pain management: pain level controlled Vital Signs Assessment: post-procedure vital signs reviewed and stable Respiratory status: spontaneous breathing, nonlabored ventilation, respiratory function stable and patient connected to nasal cannula oxygen Cardiovascular status: blood pressure returned to baseline and stable Postop Assessment: no apparent nausea or vomiting Anesthetic complications: no   No notable events documented.   Last Vitals:  Vitals:   04/22/23 1104 04/22/23 1114  BP:  (!) 139/56  Pulse: 80 70  Resp:    Temp:    SpO2: 98% 99%    Last Pain:  Vitals:   04/23/23 0738  TempSrc:   PainSc: 0-No pain                 Yevette Edwards

## 2023-05-07 DIAGNOSIS — Z85820 Personal history of malignant melanoma of skin: Secondary | ICD-10-CM | POA: Diagnosis not present

## 2023-05-07 DIAGNOSIS — L578 Other skin changes due to chronic exposure to nonionizing radiation: Secondary | ICD-10-CM | POA: Diagnosis not present

## 2023-05-07 DIAGNOSIS — L821 Other seborrheic keratosis: Secondary | ICD-10-CM | POA: Diagnosis not present

## 2023-05-07 DIAGNOSIS — D485 Neoplasm of uncertain behavior of skin: Secondary | ICD-10-CM | POA: Diagnosis not present

## 2023-05-07 DIAGNOSIS — K224 Dyskinesia of esophagus: Secondary | ICD-10-CM | POA: Diagnosis not present

## 2023-05-07 DIAGNOSIS — Z85828 Personal history of other malignant neoplasm of skin: Secondary | ICD-10-CM | POA: Diagnosis not present

## 2023-05-07 DIAGNOSIS — L570 Actinic keratosis: Secondary | ICD-10-CM | POA: Diagnosis not present

## 2023-05-07 DIAGNOSIS — Z872 Personal history of diseases of the skin and subcutaneous tissue: Secondary | ICD-10-CM | POA: Diagnosis not present

## 2023-05-07 DIAGNOSIS — K219 Gastro-esophageal reflux disease without esophagitis: Secondary | ICD-10-CM | POA: Diagnosis not present

## 2023-05-07 DIAGNOSIS — L820 Inflamed seborrheic keratosis: Secondary | ICD-10-CM | POA: Diagnosis not present

## 2023-05-07 DIAGNOSIS — K449 Diaphragmatic hernia without obstruction or gangrene: Secondary | ICD-10-CM | POA: Diagnosis not present

## 2023-05-08 IMAGING — MR MR LUMBAR SPINE W/O CM
5 series · 31 of 48 positions shown · non-contrast
Comparison: None.

CLINICAL DATA: Lumbar spondylosis. Low back pain radiating into
right leg for 9 months.

EXAM:
MRI LUMBAR SPINE WITHOUT CONTRAST
TECHNIQUE: Multiplanar, multisequence MR imaging of the lumbar spine was
performed. No intravenous contrast was administered.

[Series 5: T2 · sagittal · 4.0mm · 0.81mm/px · 6 of 17 slices shown (1 of 2)]
[im 1/17]
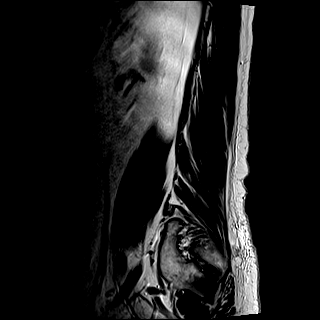
[im 4/17]
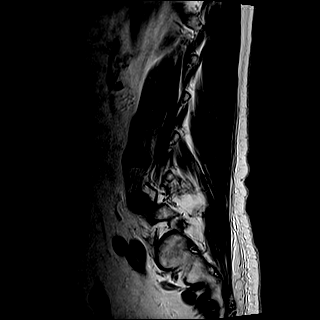
[im 7/17]
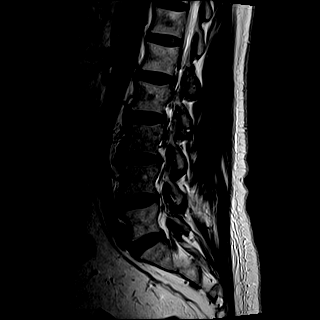
[im 10/17]
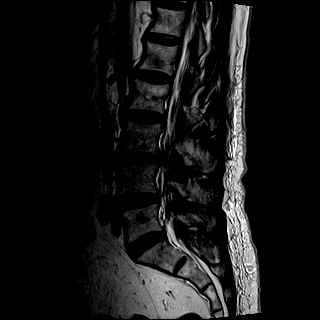
[im 13/17]
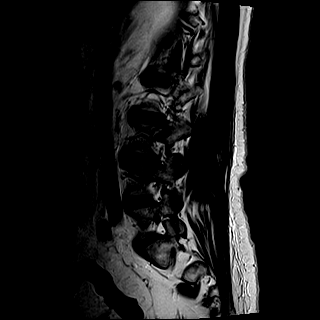
[im 17/17]
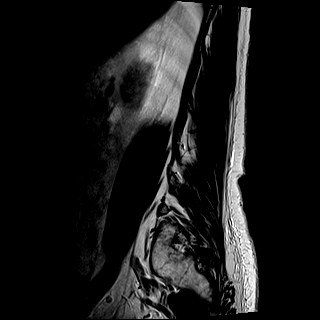

[Series 6: T1 · sagittal · 4.0mm · 0.81mm/px · 7 of 17 slices shown (1 of 2)]
[im 1/17]
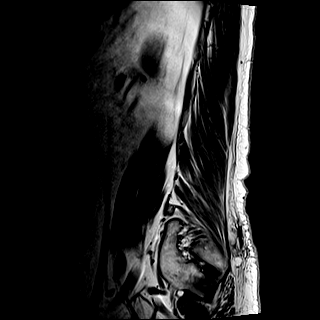
[im 3/17]
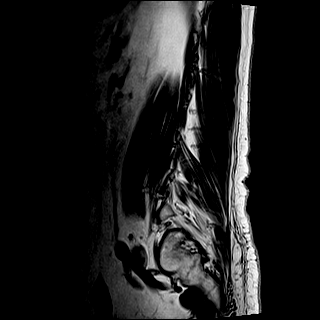
[im 6/17]
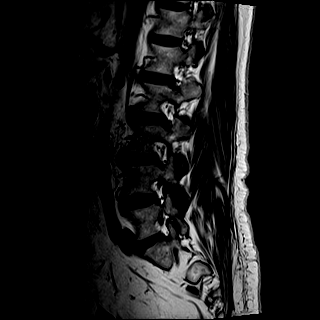
[im 9/17]
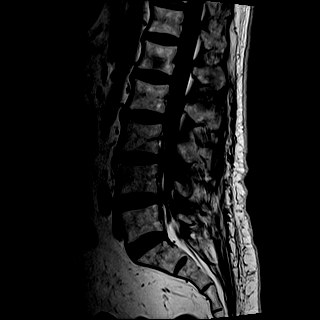
[im 11/17]
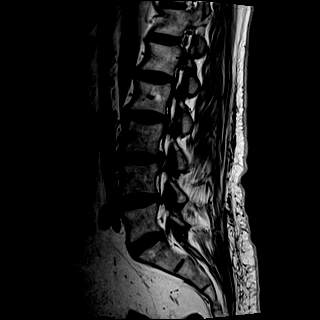
[im 14/17]
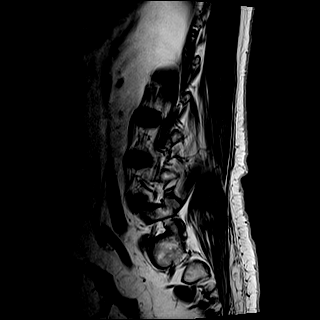
[im 17/17]
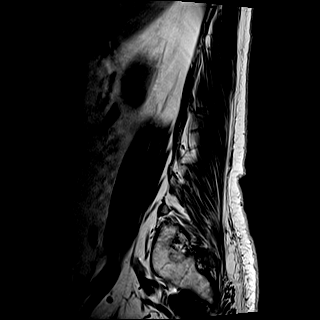

[Series 7: STIR · sagittal · 4.0mm · 0.41mm/px · 2 of 17 slices shown]
[im 1/17]
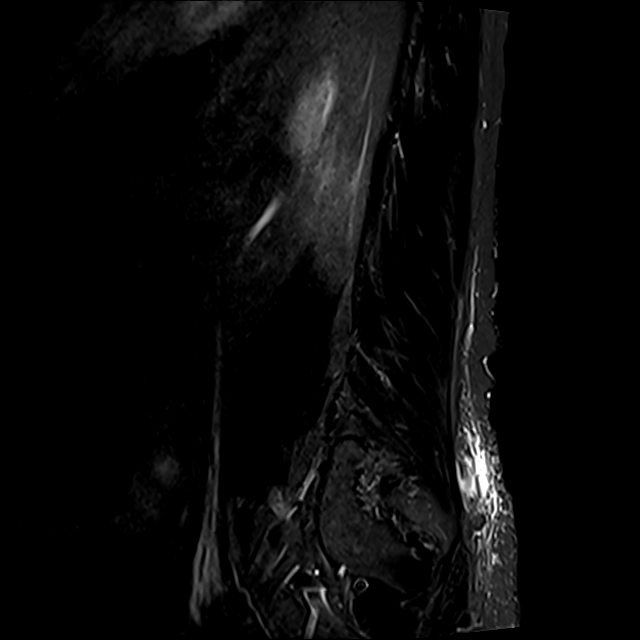
[im 3/17]
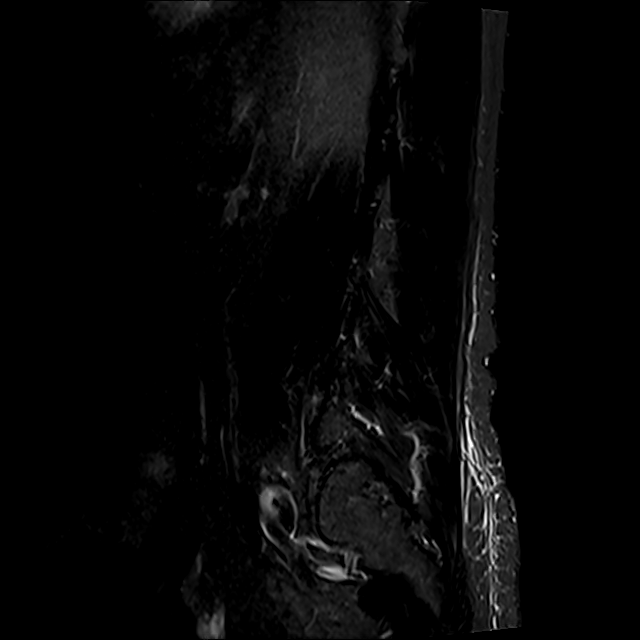

[Series 8: T2 · axial · 4.0mm · 0.78mm/px · z∈[-130,+82]mm · 8 of 36 slices shown (2 of 2)]
[im 1/36]
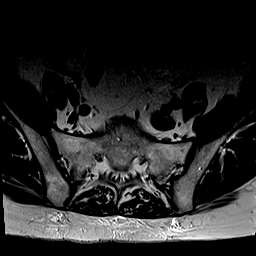
[im 6/36]
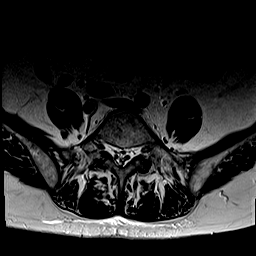
[im 11/36]
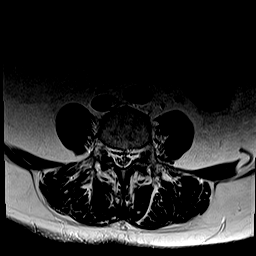
[im 17/36]
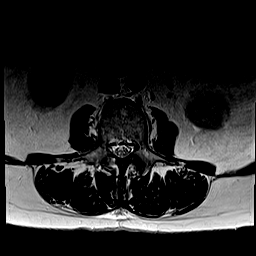
[im 19/36]
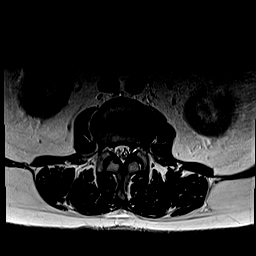
[im 25/36]
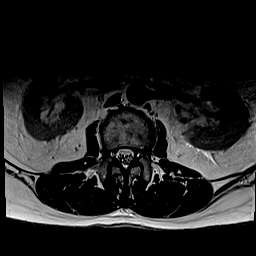
[im 30/36]
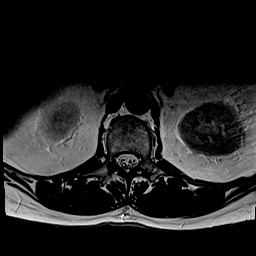
[im 36/36]
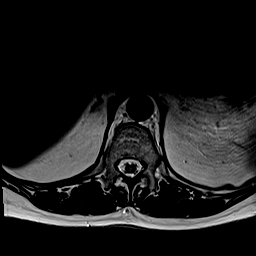

[Series 9: T1 · axial · 4.0mm · 0.39mm/px · z∈[-130,+82]mm · 8 of 36 slices shown (2 of 2)]
[im 1/36]
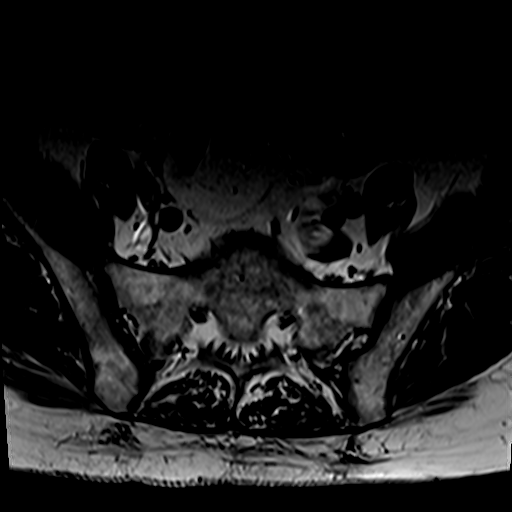
[im 6/36]
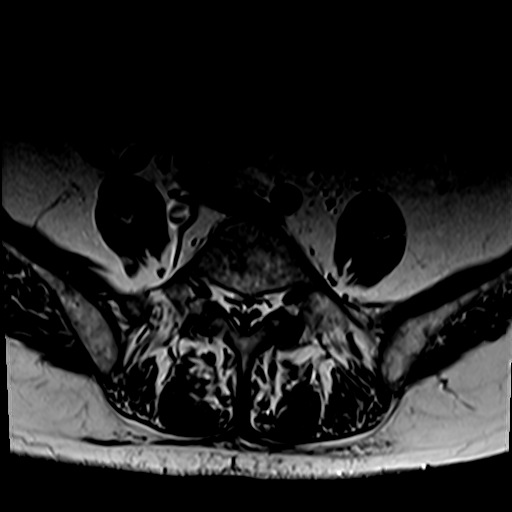
[im 11/36]
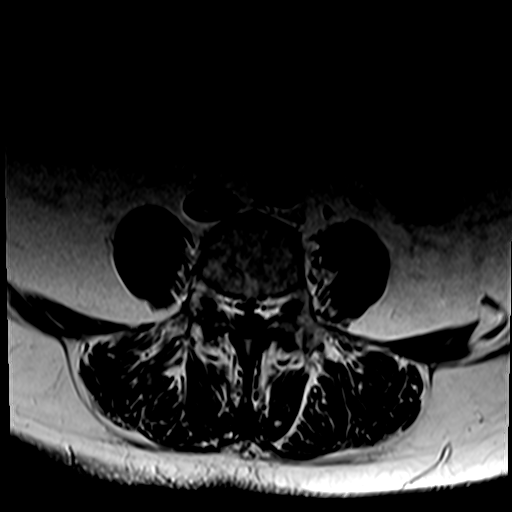
[im 17/36]
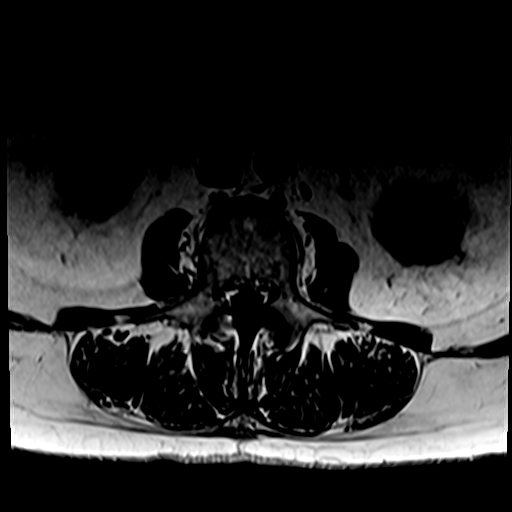
[im 19/36]
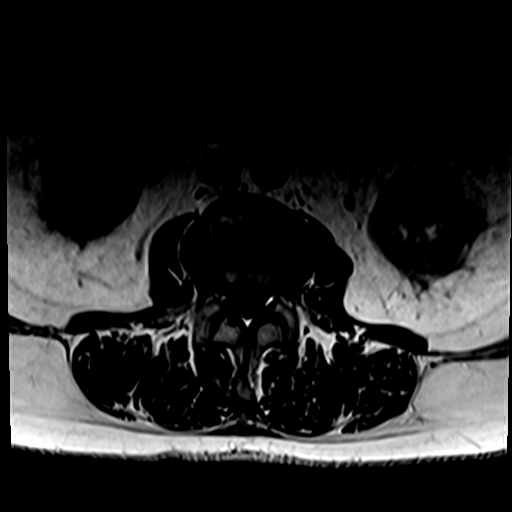
[im 25/36]
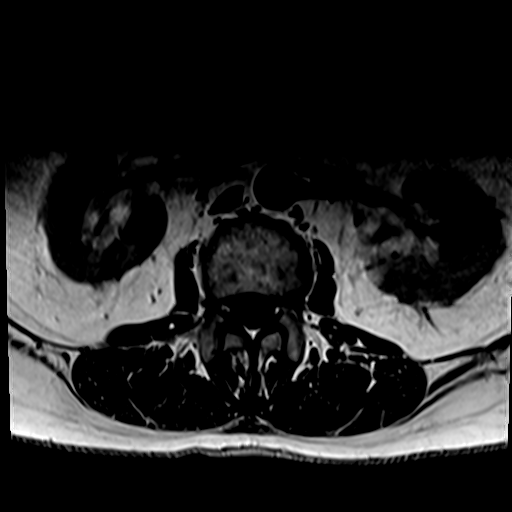
[im 30/36]
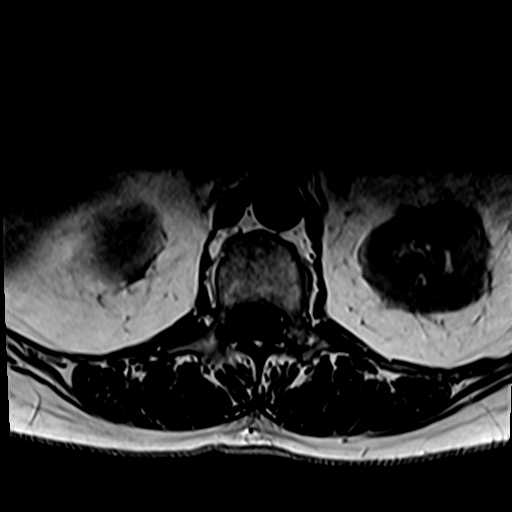
[im 36/36]
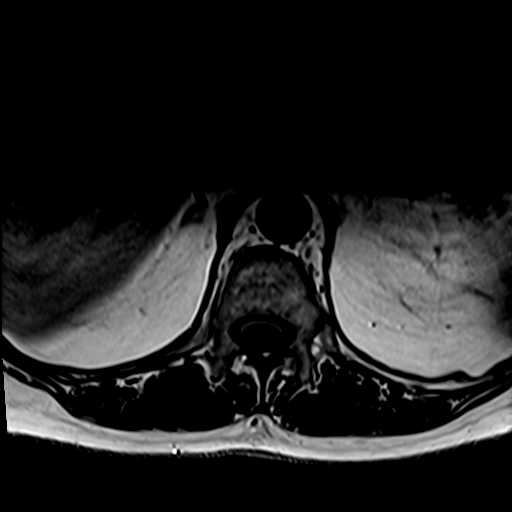

[31 of 48 positions shown; findings below may reference images not displayed]

FINDINGS: Segmentation:  Standard.

Alignment:  No significant listhesis.

Vertebrae: Vertebral body heights are preserved. No marrow edema. No
suspicious osseous lesion.

Conus medullaris and cauda equina: Conus extends to the T12-L1
level. Conus and cauda equina appear normal.

Paraspinal and other soft tissues: Unremarkable.

Disc levels: Congenital narrowing of the spinal canal.

L1-L2:  No canal or foraminal stenosis.

L2-L3: Minimal disc bulge. Minor canal stenosis. No foraminal
stenosis.

L3-L4: Disc bulge with superimposed central/right subarticular
protrusion. Moderate to marked canal stenosis. Effacement the right
subarticular recess. No foraminal stenosis.

L4-L5: Mild facet arthropathy with ligamentum flavum infolding. Mild
canal stenosis. No foraminal stenosis.

L5-S1: Prominent epidural fat effaces the thecal sac. No foraminal
stenosis.
IMPRESSION: Multilevel degenerative changes as detailed above. Most notably, a
disc herniation at L3-L4 causes canal and right subarticular recess
stenosis. There is traversing right L4 nerve root compression at
this level.

## 2023-06-03 ENCOUNTER — Ambulatory Visit
Admission: RE | Admit: 2023-06-03 | Discharge: 2023-06-03 | Disposition: A | Discharge status: Home / Self Care | Payer: PPO | Source: Ambulatory Visit | Attending: Internal Medicine | Admitting: Internal Medicine

## 2023-06-03 DIAGNOSIS — Z1231 Encounter for screening mammogram for malignant neoplasm of breast: Secondary | ICD-10-CM | POA: Insufficient documentation

## 2023-06-04 DIAGNOSIS — L570 Actinic keratosis: Secondary | ICD-10-CM | POA: Diagnosis not present

## 2023-06-12 NOTE — Progress Notes (Signed)
 [facility] - Clinic Note  06/21/2023     CHIEF COMPLAINT Patient presents for Retina Follow Up   HISTORY OF PRESENT ILLNESS: Kathryn Kemp is a 83 y.o. female who presents to the clinic today for:   HPI     Retina Follow Up   Patient presents with  Dry AMD.  This started 6 months ago.  Duration of 6 months.  Since onset it is stable.  I, the attending physician,  performed the HPI with the patient and updated documentation appropriately.        Comments   8 month retina follow up AMD pt is reporting no vision changes noticed she has noticed wavy line upper right portion amsler grid with OD she denies any flashes or floaters       Last edited by Rennis Chris, MD on 06/22/2023 12:39 AM.    Patient states she is not seeing as well as she was 6 months ago, she has to use a magnifying glass to see, she states she is seeing wavy lines in the top right of the amsler grid and grey spots on it   Referring physician: Danella Penton, MD [address] [facility] [address] [facility] [place],  [place] [address]  HISTORICAL INFORMATION:   Selected notes from the MEDICAL RECORD NUMBER Referred by Dr. Alben Spittle for ARMD OU/Geographic Atrophy OD LEE: 07.07.22 Ocular Hx-ARMD, Pseudophakia PMH-    CURRENT MEDICATIONS: No current outpatient medications on file. (Ophthalmic Drugs)   No current facility-administered medications for this visit. (Ophthalmic Drugs)   Current Outpatient Medications (Other)  Medication Sig   Calcium-Vitamin D 600-200 MG-UNIT tablet Take by mouth.   cholecalciferol (VITAMIN D3) 10 MCG/ML LIQD oral liquid Take 400 Units by mouth daily.   etodolac (LODINE) 400 MG tablet Take 400 mg by mouth every morning. (Patient not taking: Reported on 09/13/2022)   gabapentin (NEURONTIN) 100 MG capsule Take by mouth. (Patient not taking: Reported on 09/13/2022)   hydrochlorothiazide (HYDRODIURIL) 25 MG tablet Take 25 mg by mouth daily.   magnesium  oxide (MAG-OX) 400 (240 Mg) MG tablet Take 400 mg by mouth daily.   meloxicam (MOBIC) 7.5 MG tablet Take 7.5 mg by mouth daily. (Patient not taking: Reported on 09/13/2022)   montelukast (SINGULAIR) 10 MG tablet Take 10 mg by mouth at bedtime. (Patient not taking: Reported on 09/13/2022)   Multiple Vitamins-Minerals (PRESERVISION AREDS 2 PO) Take by mouth.   omeprazole (PRILOSEC) 20 MG capsule Take by mouth.   pravastatin (PRAVACHOL) 40 MG tablet Take 40 mg by mouth daily.   No current facility-administered medications for this visit. (Other)   REVIEW OF SYSTEMS: ROS   Positive for: Gastrointestinal, Skin, Musculoskeletal, Endocrine, Eyes Negative for: Constitutional, Neurological, Genitourinary, HENT, Cardiovascular, Respiratory, Psychiatric, Allergic/Imm, Heme/Lymph Last edited by Etheleen Mayhew, COT on 06/21/2023 10:00 AM.       ALLERGIES No Known Allergies  PAST MEDICAL HISTORY Past Medical History:  Diagnosis Date   Arthritis    Cancer (HCC)    skin   Esophagitis    GERD (gastroesophageal reflux disease)    H/O asbestos exposure    Hemorrhoid    external   Herpes zoster    Hyperlipemia    Hypertension    Hypothyroidism    Increased BMI    Lumbar spondylosis    Melanoma (HCC)    RIGHT UPPER ARM   Menopause    Osteoarthritis    Osteopenia    Osteoporosis  Shingles    h/o breast   SUI (stress urinary incontinence, female)    Symptomatic varicose veins of both lower extremities    Thyroid disease    Past Surgical History:  Procedure Laterality Date   CATARACT EXTRACTION     CATARACT EXTRACTION     COLONOSCOPY     COLONOSCOPY WITH PROPOFOL N/A 09/18/2017   Procedure: COLONOSCOPY WITH PROPOFOL;  Surgeon: Scot Jun, MD;  Location: [facility] ENDOSCOPY;  Service: Endoscopy;  Laterality: N/A;   ESOPHAGOGASTRODUODENOSCOPY     ESOPHAGOGASTRODUODENOSCOPY (EGD) WITH PROPOFOL N/A 04/22/2023   Procedure: ESOPHAGOGASTRODUODENOSCOPY (EGD) WITH PROPOFOL;  Surgeon:  Jaynie Collins, DO;  Location: [facility] ENDOSCOPY;  Service: Gastroenterology;  Laterality: N/A;   EYE SURGERY     MALONEY DILATION  04/22/2023   Procedure: MALONEY DILATION;  Surgeon: Jaynie Collins, DO;  Location: [facility] ENDOSCOPY;  Service: Gastroenterology;;   TUBAL LIGATION     FAMILY HISTORY Family History  Problem Relation Age of Onset   Macular degeneration Mother    Lung cancer Mother    Heart disease Mother    Osteoporosis Mother    Prostate cancer Father    Liver cancer Sister    Breast cancer Neg Hx    Diabetes Neg Hx    Colon cancer Neg Hx    Ovarian cancer Neg Hx    SOCIAL HISTORY Social History   Tobacco Use   Smoking status: Former    Types: Cigarettes    Passive exposure: Past   Smokeless tobacco: Never  Vaping Use   Vaping status: Never Used  Substance Use Topics   Alcohol use: No   Drug use: No       OPHTHALMIC EXAM: Base Eye Exam     Visual Acuity (Snellen - Linear)       Right Left   Dist [place] 20/40 -1 20/25 -3   Dist ph  NI NI         Tonometry (Tonopen, 10:04 AM)       Right Left   Pressure 16 17         Pupils       Pupils Dark Light Shape React APD   Right PERRL 3 2 Round Brisk None   Left PERRL 3 2 Round Brisk None         Visual Fields       Left Right    Full Full         Extraocular Movement       Right Left    Full, Ortho Full, Ortho         Neuro/Psych     Oriented x3: Yes   Mood/Affect: Normal         Dilation     Both eyes: 2.5% Phenylephrine @ 10:04 AM           Slit Lamp and Fundus Exam     Slit Lamp Exam       Right Left   Lids/Lashes Dermatochalasis - upper lid, Meibomian gland dysfunction Dermatochalasis - upper lid, mild MGD   Conjunctiva/Sclera White and quiet White and quiet   Cornea Arcus, well healed cataract wounds Arcus, well healed cataract wounds, trace inferior PEE   Anterior Chamber Deep and quiet Deep and quiet   Iris Round and dilated Round and dilated    Lens PCIOL in good position with open PC PC IOL in good postion with open PC   Anterior Vitreous Vitreous syneresis, PVD Vitreous syneresis, PVD  Fundus Exam       Right Left   Disc Pink and sharp, Compact, mild PPP Pink and sharp, PPA   C/D Ratio 0.3 0.3   Macula Flat, Blunted foveal reflex, drusen, RPE mottling, clumping, and central atrophy, no heme or edema, no CNV, mild progression of central GA Flat, Blunted foveal reflex, drusen, RPE mottling, clumping, and early atrophy, prominent focal pigment clump temporal mac, No heme or edema   Vessels attenuated, mild tortuosity, copper wiring, AV crossing changes attenuated, Tortuous, AV crossing changes   Periphery Attached, +reticular degeneration; no heme, No RT/RD Attached, mild reticular degeneration, no heme           IMAGING AND PROCEDURES  Imaging and Procedures for 06/21/2023  OCT, Retina - OU - Both Eyes       Right Eye Quality was good. Central Foveal Thickness: 251. Progression has been stable. Findings include normal foveal contour, no IRF, no SRF, retinal drusen , outer retinal atrophy (Central ORA and drusen).   Left Eye Quality was good. Central Foveal Thickness: 277. Progression has been stable. Findings include normal foveal contour, no IRF, no SRF, retinal drusen , subretinal hyper-reflective material, pigment epithelial detachment, outer retinal atrophy (Central ORA and drusen).   Notes *Images captured and stored on drive  Diagnosis / Impression:  Non exudative ARMD OU Central ORA and drusen - stable OU  Clinical management:  See below  Abbreviations: NFP - Normal foveal profile. CME - cystoid macular edema. PED - pigment epithelial detachment. IRF - intraretinal fluid. SRF - subretinal fluid. EZ - ellipsoid zone. ERM - epiretinal membrane. ORA - outer retinal atrophy. ORT - outer retinal tubulation. SRHM - subretinal hyper-reflective material. IRHM - intraretinal hyper-reflective material             ASSESSMENT/PLAN:    ICD-10-CM   1. Advanced atrophic nonexudative age-related macular degeneration of both eyes without subfoveal involvement  H35.3133 OCT, Retina - OU - Both Eyes    2. Essential hypertension  I10     3. Pseudophakia of both eyes  Z96.1     4. Dry eye  H04.129     5. Hypertensive retinopathy of both eyes  H35.033     6. PCO (posterior capsular opacification), bilateral  H26.493       1. Age related macular degeneration, non-exudative, both eyes  - +family hx of ARMD -- pt reports mother had wet AMD with significant vision loss  - pt has already been on AREDS 2 supplementation due to family history and has been monitoring her vision w/ Amsler grid provided by Dr. Alben Spittle  - pt notes focal distortion superior paracentral OD             - Recommend amsler grid monitoring  - FA 10.18.22 -- no leakage or CNV OU; mild staining of focal vitelliform like lesion OS -- inferior to fovea   - BCVA 20/40 OD - decreased from 20/30, 20/25 OS - stable  - OCT shows OD: Central ORA and drusen - stable; OS: central SHRM/vitelliform like lesion -- stable - f/u 6 months, DFE/OCT  2,3. Hypertensive retinopathy OU - discussed importance of tight BP control - monitor  4. Pseudophakia OU  - s/p CE/IOL OU [facility])  - IOL in good position, doing well  - monitor  5. Dry Eye Syndrome OU            - recommend artificial tears qid OU  6. PCO OU s/p YAG OU  -  pt reports significant glare symptoms OU  - s/p Yag OD (12.22.23) -- good PC opening  - s/p YAG OS 06.06.24 -- good PC opening  - monitor   Ophthalmic Meds Ordered this visit:  No orders of the defined types were placed in this encounter.   This document serves as a record of services personally performed by Karie Chimera, MD, PhD. It was created on their behalf by Berlin Hun COT, an ophthalmic technician. The creation of this record is the provider's dictation and/or activities during the  visit.    Electronically signed by: Berlin Hun COT 03.05.2512:41 AM  This document serves as a record of services personally performed by Karie Chimera, MD, PhD. It was created on their behalf by Glee Arvin. Manson Passey, OA an ophthalmic technician. The creation of this record is the provider's dictation and/or activities during the visit.    Electronically signed by: Glee Arvin. Manson Passey, OA 06/22/23 12:41 AM  Karie Chimera, M.D., Ph.D. Diseases & Surgery of the Retina and Vitreous [facility] 06/21/2023   I have reviewed the above documentation for accuracy and completeness, and I agree with the above. Karie Chimera, M.D., Ph.D. 06/22/23 12:43 AM    Abbreviations: M myopia (nearsighted); A astigmatism; H hyperopia (farsighted); P presbyopia; Mrx spectacle prescription;  CTL contact lenses; OD right eye; OS left eye; OU both eyes  XT exotropia; ET esotropia; PEK punctate epithelial keratitis; PEE punctate epithelial erosions; DES dry eye syndrome; MGD meibomian gland dysfunction; ATs artificial tears; PFAT's preservative free artificial tears; NSC nuclear sclerotic cataract; PSC posterior subcapsular cataract; ERM epi-retinal membrane; PVD posterior vitreous detachment; RD retinal detachment; DM diabetes mellitus; DR diabetic retinopathy; NPDR non-proliferative diabetic retinopathy; PDR proliferative diabetic retinopathy; CSME clinically significant macular edema; DME diabetic macular edema; dbh dot blot hemorrhages; CWS cotton wool spot; POAG primary open angle glaucoma; C/D cup-to-disc ratio; HVF humphrey visual field; GVF goldmann visual field; OCT optical coherence tomography; IOP intraocular pressure; BRVO Branch retinal vein occlusion; CRVO central retinal vein occlusion; CRAO central retinal artery occlusion; BRAO branch retinal artery occlusion; RT retinal tear; SB scleral buckle; PPV pars plana vitrectomy; VH Vitreous hemorrhage; PRP panretinal laser  photocoagulation; IVK intravitreal kenalog; VMT vitreomacular traction; MH Macular hole;  NVD neovascularization of the disc; NVE neovascularization elsewhere; AREDS age related eye disease study; ARMD age related macular degeneration; POAG primary open angle glaucoma; EBMD epithelial/anterior basement membrane dystrophy; ACIOL anterior chamber intraocular lens; IOL intraocular lens; PCIOL posterior chamber intraocular lens; Phaco/IOL phacoemulsification with intraocular lens placement; PRK photorefractive keratectomy; LASIK laser assisted in situ keratomileusis; HTN hypertension; DM diabetes mellitus; COPD chronic obstructive pulmonary disease

## 2023-06-21 ENCOUNTER — Ambulatory Visit (INDEPENDENT_AMBULATORY_CARE_PROVIDER_SITE_OTHER): Payer: PPO | Admitting: Ophthalmology

## 2023-06-21 ENCOUNTER — Encounter (INDEPENDENT_AMBULATORY_CARE_PROVIDER_SITE_OTHER): Payer: Self-pay | Admitting: Ophthalmology

## 2023-06-21 DIAGNOSIS — H04129 Dry eye syndrome of unspecified lacrimal gland: Secondary | ICD-10-CM

## 2023-06-21 DIAGNOSIS — H353133 Nonexudative age-related macular degeneration, bilateral, advanced atrophic without subfoveal involvement: Secondary | ICD-10-CM | POA: Diagnosis not present

## 2023-06-21 DIAGNOSIS — H353132 Nonexudative age-related macular degeneration, bilateral, intermediate dry stage: Secondary | ICD-10-CM

## 2023-06-21 DIAGNOSIS — H26493 Other secondary cataract, bilateral: Secondary | ICD-10-CM | POA: Diagnosis not present

## 2023-06-21 DIAGNOSIS — Z961 Presence of intraocular lens: Secondary | ICD-10-CM | POA: Diagnosis not present

## 2023-06-21 DIAGNOSIS — I10 Essential (primary) hypertension: Secondary | ICD-10-CM

## 2023-06-21 DIAGNOSIS — H35033 Hypertensive retinopathy, bilateral: Secondary | ICD-10-CM

## 2023-06-22 ENCOUNTER — Encounter (INDEPENDENT_AMBULATORY_CARE_PROVIDER_SITE_OTHER): Payer: Self-pay | Admitting: Ophthalmology

## 2023-07-15 DIAGNOSIS — L910 Hypertrophic scar: Secondary | ICD-10-CM | POA: Diagnosis not present

## 2023-08-29 DIAGNOSIS — M19042 Primary osteoarthritis, left hand: Secondary | ICD-10-CM | POA: Diagnosis not present

## 2023-08-29 DIAGNOSIS — M19041 Primary osteoarthritis, right hand: Secondary | ICD-10-CM | POA: Diagnosis not present

## 2023-08-29 DIAGNOSIS — R739 Hyperglycemia, unspecified: Secondary | ICD-10-CM | POA: Diagnosis not present

## 2023-08-29 DIAGNOSIS — E782 Mixed hyperlipidemia: Secondary | ICD-10-CM | POA: Diagnosis not present

## 2023-08-29 DIAGNOSIS — M8588 Other specified disorders of bone density and structure, other site: Secondary | ICD-10-CM | POA: Diagnosis not present

## 2023-09-05 DIAGNOSIS — I83892 Varicose veins of left lower extremities with other complications: Secondary | ICD-10-CM | POA: Diagnosis not present

## 2023-09-05 DIAGNOSIS — K2100 Gastro-esophageal reflux disease with esophagitis, without bleeding: Secondary | ICD-10-CM | POA: Diagnosis not present

## 2023-09-05 DIAGNOSIS — R739 Hyperglycemia, unspecified: Secondary | ICD-10-CM | POA: Diagnosis not present

## 2023-09-05 DIAGNOSIS — C4361 Malignant melanoma of right upper limb, including shoulder: Secondary | ICD-10-CM | POA: Diagnosis not present

## 2023-09-05 DIAGNOSIS — Z0000 Encounter for general adult medical examination without abnormal findings: Secondary | ICD-10-CM | POA: Diagnosis not present

## 2023-09-05 DIAGNOSIS — E538 Deficiency of other specified B group vitamins: Secondary | ICD-10-CM | POA: Diagnosis not present

## 2023-09-05 DIAGNOSIS — E782 Mixed hyperlipidemia: Secondary | ICD-10-CM | POA: Diagnosis not present

## 2023-09-05 DIAGNOSIS — D500 Iron deficiency anemia secondary to blood loss (chronic): Secondary | ICD-10-CM | POA: Diagnosis not present

## 2023-09-06 ENCOUNTER — Other Ambulatory Visit: Payer: Self-pay | Admitting: Internal Medicine

## 2023-09-06 DIAGNOSIS — E782 Mixed hyperlipidemia: Secondary | ICD-10-CM

## 2023-09-06 DIAGNOSIS — Z0000 Encounter for general adult medical examination without abnormal findings: Secondary | ICD-10-CM

## 2023-09-11 ENCOUNTER — Ambulatory Visit
Admission: RE | Admit: 2023-09-11 | Discharge: 2023-09-11 | Disposition: A | Discharge status: Home / Self Care | Payer: Self-pay | Source: Ambulatory Visit | Attending: Internal Medicine | Admitting: Internal Medicine

## 2023-09-11 DIAGNOSIS — Z0000 Encounter for general adult medical examination without abnormal findings: Secondary | ICD-10-CM | POA: Insufficient documentation

## 2023-09-11 DIAGNOSIS — E782 Mixed hyperlipidemia: Secondary | ICD-10-CM | POA: Insufficient documentation

## 2023-09-18 DIAGNOSIS — L570 Actinic keratosis: Secondary | ICD-10-CM | POA: Diagnosis not present

## 2023-10-18 ENCOUNTER — Other Ambulatory Visit (INDEPENDENT_AMBULATORY_CARE_PROVIDER_SITE_OTHER): Payer: Self-pay | Admitting: Vascular Surgery

## 2023-10-18 DIAGNOSIS — I83892 Varicose veins of left lower extremities with other complications: Secondary | ICD-10-CM

## 2023-10-25 ENCOUNTER — Ambulatory Visit (INDEPENDENT_AMBULATORY_CARE_PROVIDER_SITE_OTHER)

## 2023-10-25 ENCOUNTER — Encounter (INDEPENDENT_AMBULATORY_CARE_PROVIDER_SITE_OTHER): Payer: Self-pay | Admitting: Vascular Surgery

## 2023-10-25 ENCOUNTER — Ambulatory Visit (INDEPENDENT_AMBULATORY_CARE_PROVIDER_SITE_OTHER): Admitting: Vascular Surgery

## 2023-10-25 VITALS — BP 131/70 | HR 80 | Ht 59.0 in | Wt 133.4 lb

## 2023-10-25 DIAGNOSIS — I83892 Varicose veins of left lower extremities with other complications: Secondary | ICD-10-CM | POA: Diagnosis not present

## 2023-10-25 DIAGNOSIS — I10 Essential (primary) hypertension: Secondary | ICD-10-CM

## 2023-10-25 DIAGNOSIS — E782 Mixed hyperlipidemia: Secondary | ICD-10-CM | POA: Diagnosis not present

## 2023-10-25 DIAGNOSIS — I83893 Varicose veins of bilateral lower extremities with other complications: Secondary | ICD-10-CM | POA: Diagnosis not present

## 2023-10-25 NOTE — Progress Notes (Signed)
 Patient ID: Kathryn Kemp, female   DOB: 02/18/1941, 83 y.o.   MRN: [id number]  Chief Complaint  Patient presents with   LS 7.11.22 gs. LLE reflux + consult. see gs/fb/bp. Varicose    HPI Kathryn Kemp is a 83 y.o. female.  I am asked to see the patient by Dr. Cleotilde for evaluation of venous insufficiency of the lower extremities with increasing painful varicosities of the left lower leg.  She was last seen in our office a little more than 3 years ago.  She had laser ablation performed to the saphenous vein on the left side.  This was done in 2022.  At that point, she really had minimal residual varicosities and no further treatment was needed.  She has developed enlarging varicosities on that left lower leg over the last year or 2.  These have now become very painful and created more swelling.  She continues to wear her compression socks on a daily basis.  She elevates her legs and takes anti-inflammatories for discomfort.  The pain and swelling and become painful and bothersome on a daily basis and she now desires treatment.  We performed a venous reflux study on her which showed successful ablation of the left great saphenous vein.  There is an incompetent saphenous vein branch or accessory saphenous vein in the left lower leg that correlates with her painful varicosities.  No DVT or superficial thrombophlebitis was identified.   Past Medical History:  Diagnosis Date   Arthritis    Cancer (HCC)    skin   Esophagitis    GERD (gastroesophageal reflux disease)    H/O asbestos exposure    Hemorrhoid    external   Herpes zoster    Hyperlipemia    Hypertension    Hypothyroidism    Increased BMI    Lumbar spondylosis    Melanoma (HCC)    RIGHT UPPER ARM   Menopause    Osteoarthritis    Osteopenia    Osteoporosis    Shingles    h/o breast   SUI (stress urinary incontinence, female)    Symptomatic varicose veins of both lower extremities    Thyroid  disease     Past Surgical  History:  Procedure Laterality Date   CATARACT EXTRACTION     CATARACT EXTRACTION     COLONOSCOPY     COLONOSCOPY WITH PROPOFOL  N/A 09/18/2017   Procedure: COLONOSCOPY WITH PROPOFOL ;  Surgeon: Viktoria Lamar DASEN, MD;  Location: [facility] ENDOSCOPY;  Service: Endoscopy;  Laterality: N/A;   ESOPHAGOGASTRODUODENOSCOPY     ESOPHAGOGASTRODUODENOSCOPY (EGD) WITH PROPOFOL  N/A 04/22/2023   Procedure: ESOPHAGOGASTRODUODENOSCOPY (EGD) WITH PROPOFOL ;  Surgeon: Onita Elspeth Sharper, DO;  Location: [facility] ENDOSCOPY;  Service: Gastroenterology;  Laterality: N/A;   EYE SURGERY     MALONEY DILATION  04/22/2023   Procedure: MALONEY DILATION;  Surgeon: Onita Elspeth Sharper, DO;  Location: [facility] ENDOSCOPY;  Service: Gastroenterology;;   TUBAL LIGATION       Family History  Problem Relation Age of Onset   Macular degeneration Mother    Lung cancer Mother    Heart disease Mother    Osteoporosis Mother    Prostate cancer Father    Liver cancer Sister    Breast cancer Neg Hx    Diabetes Neg Hx    Colon cancer Neg Hx    Ovarian cancer Neg Hx       Social History   Tobacco Use   Smoking status: Former    Types: Cigarettes  Passive exposure: Past   Smokeless tobacco: Never  Vaping Use   Vaping status: Never Used  Substance Use Topics   Alcohol use: No   Drug use: No     No Known Allergies  Current Outpatient Medications  Medication Sig Dispense Refill   Calcium-Vitamin D 600-200 MG-UNIT tablet Take by mouth.     cholecalciferol (VITAMIN D3) 10 MCG/ML LIQD oral liquid Take 400 Units by mouth daily.     hydrochlorothiazide (HYDRODIURIL) 25 MG tablet Take 25 mg by mouth daily.     magnesium oxide (MAG-OX) 400 (240 Mg) MG tablet Take 400 mg by mouth daily.     Multiple Vitamins-Minerals (PRESERVISION AREDS 2 PO) Take by mouth.     omeprazole (PRILOSEC) 20 MG capsule Take by mouth.     pravastatin (PRAVACHOL) 40 MG tablet Take 40 mg by mouth daily.     etodolac (LODINE) 400 MG tablet Take 400 mg  by mouth every morning. (Patient not taking: Reported on 10/25/2023)     gabapentin (NEURONTIN) 100 MG capsule Take by mouth. (Patient not taking: Reported on 10/25/2023)     meloxicam (MOBIC) 7.5 MG tablet Take 7.5 mg by mouth daily. (Patient not taking: Reported on 10/25/2023)     montelukast (SINGULAIR) 10 MG tablet Take 10 mg by mouth at bedtime. (Patient not taking: Reported on 10/25/2023)     No current facility-administered medications for this visit.      REVIEW OF SYSTEMS (Negative unless checked)  Constitutional: [] Weight loss  [] Fever  [] Chills Cardiac: [] Chest pain   [] Chest pressure   [] Palpitations   [] Shortness of breath when laying flat   [] Shortness of breath at rest   [] Shortness of breath with exertion. Vascular:  [x] Pain in legs with walking   [x] Pain in legs at rest   [] Pain in legs when laying flat   [] Claudication   [] Pain in feet when walking  [] Pain in feet at rest  [] Pain in feet when laying flat   [] History of DVT   [] Phlebitis   [x] Swelling in legs   [x] Varicose veins   [] Non-healing ulcers Pulmonary:   [] Uses home oxygen   [] Productive cough   [] Hemoptysis   [] Wheeze  [] COPD   [] Asthma Neurologic:  [] Dizziness  [] Blackouts   [] Seizures   [] History of stroke   [] History of TIA  [] Aphasia   [] Temporary blindness   [] Dysphagia   [] Weakness or numbness in arms   [] Weakness or numbness in legs Musculoskeletal:  [x] Arthritis   [] Joint swelling   [] Joint pain   [] Low back pain Hematologic:  [] Easy bruising  [] Easy bleeding   [] Hypercoagulable state   [] Anemic  [] Hepatitis Gastrointestinal:  [] Blood in stool   [] Vomiting blood  [x] Gastroesophageal reflux/heartburn   [] Abdominal pain Genitourinary:  [] Chronic kidney disease   [] Difficult urination  [] Frequent urination  [] Burning with urination   [] Hematuria Skin:  [] Rashes   [] Ulcers   [] Wounds Psychological:  [] History of anxiety   []  History of major depression.    Physical Exam BP 131/70   Pulse 80   Ht 4' 11 (1.499  m)   Wt 133 lb 6 oz (60.5 kg)   BMI 26.94 kg/m  Gen:  WD/WN, NAD. Appears younger than stated age. Head: [place]/AT, No temporalis wasting.  Ear/Nose/Throat: Hearing grossly intact, nares w/o erythema or drainage, oropharynx w/o Erythema/Exudate Eyes: Conjunctiva clear, sclera non-icteric  Neck: trachea midline.  No JVD.  Pulmonary:  Good air movement, respirations not labored, no use of accessory muscles  Cardiac: RRR,  no JVD Vascular: 2 to 3 mm varicosities in the left medial knee and lower leg.  No erythema or ulceration. Vessel Right Left  Radial Palpable Palpable                                   Gastrointestinal:. No masses, surgical incisions, or scars. Musculoskeletal: M/S 5/5 throughout.  Extremities without ischemic changes.  No deformity or atrophy. Trace LLE edema. Neurologic: Sensation grossly intact in extremities.  Symmetrical.  Speech is fluent. Motor exam as listed above. Psychiatric: Judgment intact, Mood & affect appropriate for pt's clinical situation. Dermatologic: No rashes or ulcers noted.  No cellulitis or open wounds.    Radiology No results found.  Labs No results found for this or any previous visit (from the past 2160 hours).  Assessment/Plan:  Symptomatic varicose veins of both lower extremities We performed a venous reflux study on her which showed successful ablation of the left great saphenous vein.  There is an incompetent saphenous vein branch or accessory saphenous vein in the left lower leg that correlates with her painful varicosities.  No DVT or superficial thrombophlebitis was identified. Given her persistent symptoms despite appropriate conservative therapies that have progressed over time, treatment for these painful varicosities should be considered.  This vein is far too shallow and tortuous for another laser ablation, but foam sclerotherapy would be an excellent treatment option for treatment of the incompetent painful varicosities of  the left lower extremity.  I discussed the risks and benefits of the procedure.  Patient desires to proceed.  Benign essential hypertension blood pressure control important in reducing the progression of atherosclerotic disease. On appropriate oral medications.   Combined hyperlipidemia lipid control important in reducing the progression of atherosclerotic disease. Continue statin therapy      Selinda Gu 10/28/2023, 11:14 AM   This note was created with Dragon medical transcription system.  Any errors from dictation are unintentional.

## 2023-10-28 NOTE — Patient Instructions (Signed)
 Sclerotherapy Sclerotherapy is a procedure that is done to make varicose veins and spider veins look better and it helps to relieve aching, swelling, cramping, and pain in the legs. Varicose veins are veins that have become enlarged, bulging, and twisted due to a damaged valve that causes blood to collect (pool) in the veins. Spider veins are small varicose veins. Sclerotherapy is usually done on the legs where varicose and spider veins occur most of the time. Sclerotherapy usually works best for smaller spider and varicose veins. This procedure involves putting a chemical directly into the lining of the vein, causing it to swell and stick together. Over time, the vessel turns into scar tissue that fades from view. You may need more than one treatment to close a vein all the way. The number of veins treated in one session depends on the size and location of the veins, and on your overall medical condition. Tell a health care provider about: Any allergies you have. All medicines you are taking, including vitamins, herbs, eye drops, creams, and over-the-counter medicines. Any bleeding problems you have. Any surgeries you have had. Any medical conditions you have. Whether you are pregnant or may be pregnant. What are the risks? Your health care provider will talk with you about risks. These may include: Infection. Bleeding or blood clots. Allergic reactions to medicines or to the chemicals being used, which are called sclerosing agents. Larger treated veins becoming lumpy or hard. This may last for several months before getting better. Small sores (ulcers) forming at the injection site. Red streaking in the groin area or bruising around the injection site. Brown lines or spots at the injection site. These usually disappear within 3 to 6 months, but in rare cases they can be permanent. What happens before the procedure? Medicines Ask your health care provider about: Changing or stopping your  regular medicines. These include any diabetes medicines or blood thinners you take. Taking medicines such as aspirin and ibuprofen. These medicines can thin your blood. Do not take them unless your health care provider tells you to. Taking over-the-counter medicines, vitamins, herbs, and supplements. Tests You may have an ultrasound of the affected area to check for blood clots and to check blood flow. In rare cases, you may have an X-ray procedure to check how blood flows through your veins (angiogram). For an angiogram, a dye is injected to highlight your veins on X-rays. General instructions Do not use lotions or creams on your legs before the procedure unless your health care provider approves. Follow instructions from your health care provider about what you may eat and drink. Do not use any products that contain nicotine or tobacco before the procedure. These products include cigarettes, chewing tobacco, and vaping devices, such as e-cigarettes. If you need help quitting, ask your health care provider. Ask your health care provider what steps will be taken to help prevent infection. These steps may include: Removing hair at the injection site. Washing skin with a soap that kills germs. What happens during the procedure?  The treatment area will be cleaned. A small, thin needle is used to inject a chemical (sclerosant) into your varicose or spider veins. The sclerosant will irritate the lining of the vein and cause the vein to close below where the needle was put in. You may feel some stinging, burning, or irritation. The injection may be repeated for more than one varicose or spider vein. After the procedure, the area around where the needle was put in will be  wrapped with elastic bandages. The procedure may vary among health care providers and hospitals. What can I expect after the procedure? Your blood pressure, heart rate, breathing rate, and blood oxygen level will be monitored until  you leave the hospital or clinic. The area around the injection site will be wrapped with elastic bandages. If there is bleeding, the bandages may be changed. After the treatment, you will be able to drive yourself home. Wear compression stockings as told by your health care provider. These stockings help to prevent blood clots and reduce swelling in your legs. Contact a health care provider if: You have more redness, swelling, or pain around any injection sites. You have more fluid or blood coming from any injection sites. Any injection sites feel warm to the touch. You have pus or a bad smell coming from any injection sites. You have a fever. Get help right away if: You have leg pain that gets worse when you walk. You have redness or swelling in your leg that is getting worse. You have trouble breathing. You have chest pain. These symptoms may be an emergency. Get help right away. Call 911. Do not wait to see if the symptoms will go away. Do not drive yourself to the hospital. Summary Sclerotherapy is a procedure that is done to make varicose veins and spider veins look better and it helps to relieve aching, swelling, cramping, and pain in the legs. A small, thin needle is used to inject a chemical (sclerosant) into a spider vein or varicose vein to close it. Elastic bandages will be wrapped around any injection sites after the procedure. Wear compression stockings as told by your health care provider. These stockings help to prevent blood clots and reduce swelling in your legs. This information is not intended to replace advice given to you by your health care provider. Make sure you discuss any questions you have with your health care provider. Document Revised: 06/29/2021 Document Reviewed: 06/29/2021 Elsevier Patient Education  2024 [employer].

## 2023-10-28 NOTE — Assessment & Plan Note (Signed)
 blood pressure control important in reducing the progression of atherosclerotic disease. On appropriate oral medications.

## 2023-10-28 NOTE — Assessment & Plan Note (Signed)
 lipid control important in reducing the progression of atherosclerotic disease. Continue statin therapy

## 2023-10-28 NOTE — Assessment & Plan Note (Signed)
 We performed a venous reflux study on her which showed successful ablation of the left great saphenous vein.  There is an incompetent saphenous vein branch or accessory saphenous vein in the left lower leg that correlates with her painful varicosities.  No DVT or superficial thrombophlebitis was identified. Given her persistent symptoms despite appropriate conservative therapies that have progressed over time, treatment for these painful varicosities should be considered.  This vein is far too shallow and tortuous for another laser ablation, but foam sclerotherapy would be an excellent treatment option for treatment of the incompetent painful varicosities of the left lower extremity.  I discussed the risks and benefits of the procedure.  Patient desires to proceed.

## 2023-11-04 DIAGNOSIS — L570 Actinic keratosis: Secondary | ICD-10-CM | POA: Diagnosis not present

## 2023-11-04 DIAGNOSIS — L821 Other seborrheic keratosis: Secondary | ICD-10-CM | POA: Diagnosis not present

## 2023-11-04 DIAGNOSIS — Z85820 Personal history of malignant melanoma of skin: Secondary | ICD-10-CM | POA: Diagnosis not present

## 2023-11-04 DIAGNOSIS — L578 Other skin changes due to chronic exposure to nonionizing radiation: Secondary | ICD-10-CM | POA: Diagnosis not present

## 2023-11-04 DIAGNOSIS — Z85828 Personal history of other malignant neoplasm of skin: Secondary | ICD-10-CM | POA: Diagnosis not present

## 2023-11-04 DIAGNOSIS — Z872 Personal history of diseases of the skin and subcutaneous tissue: Secondary | ICD-10-CM | POA: Diagnosis not present

## 2023-12-02 DIAGNOSIS — E782 Mixed hyperlipidemia: Secondary | ICD-10-CM | POA: Diagnosis not present

## 2023-12-16 NOTE — Progress Notes (Signed)
 [facility] - Clinic Note  12/25/2023     CHIEF COMPLAINT Patient presents for Retina Follow Up   HISTORY OF PRESENT ILLNESS: Kathryn Kemp is a 83 y.o. female who presents to the clinic today for:   HPI     Retina Follow Up   Patient presents with  Dry AMD.  In both eyes.  This started 3 years ago.  Duration of 6 months.  Since onset it is stable.  I, the attending physician,  performed the HPI with the patient and updated documentation appropriately.        Comments   Pt denies any changes in vision, monitors the amsler grid and the same lines are distorted. Pt denies FOL/floaters/pain. Pt is using Systane BID OU but eyes still feel gritty. Pt states she has had extra photophobia since cataract surgery. Pt states she has been taking AREDS even before she was diagnosed with ARMD since her mother had wet ARMD.      Last edited by Valdemar Rogue, MD on 01/04/2024 11:48 AM.     Patient states the vision in the right eye is cloudy.  Referring physician: Cleotilde Oneil FALCON, MD [phone] [facility] [address] [facility] [place],  [place] [address]  HISTORICAL INFORMATION:   Selected notes from the MEDICAL RECORD NUMBER Referred by Dr. Lelon for ARMD OU/Geographic Atrophy OD LEE: 07.07.22 Ocular Hx-ARMD, Pseudophakia PMH-    CURRENT MEDICATIONS: No current outpatient medications on file. (Ophthalmic Drugs)   No current facility-administered medications for this visit. (Ophthalmic Drugs)   Current Outpatient Medications (Other)  Medication Sig   Calcium-Vitamin D 600-200 MG-UNIT tablet Take by mouth.   cholecalciferol (VITAMIN D3) 10 MCG/ML LIQD oral liquid Take 400 Units by mouth daily.   etodolac (LODINE) 400 MG tablet Take 400 mg by mouth every morning. (Patient not taking: Reported on 10/25/2023)   gabapentin (NEURONTIN) 100 MG capsule Take by mouth. (Patient not taking: Reported on 10/25/2023)   hydrochlorothiazide (HYDRODIURIL) 25 MG tablet  Take 25 mg by mouth daily.   magnesium oxide (MAG-OX) 400 (240 Mg) MG tablet Take 400 mg by mouth daily.   meloxicam (MOBIC) 7.5 MG tablet Take 7.5 mg by mouth daily. (Patient not taking: Reported on 10/25/2023)   montelukast (SINGULAIR) 10 MG tablet Take 10 mg by mouth at bedtime. (Patient not taking: Reported on 10/25/2023)   Multiple Vitamins-Minerals (PRESERVISION AREDS 2 PO) Take by mouth.   omeprazole (PRILOSEC) 20 MG capsule Take by mouth.   pravastatin (PRAVACHOL) 40 MG tablet Take 40 mg by mouth daily.   No current facility-administered medications for this visit. (Other)   REVIEW OF SYSTEMS: ROS   Positive for: Gastrointestinal, Skin, Musculoskeletal, Endocrine, Eyes Negative for: Constitutional, Neurological, Genitourinary, HENT, Cardiovascular, Respiratory, Psychiatric, Allergic/Imm, Heme/Lymph Last edited by Elnor Avelina RAMAN, COT on 12/25/2023  9:45 AM.        ALLERGIES No Known Allergies  PAST MEDICAL HISTORY Past Medical History:  Diagnosis Date   Arthritis    Cancer (HCC)    skin   Esophagitis    GERD (gastroesophageal reflux disease)    H/O asbestos exposure    Hemorrhoid    external   Herpes zoster    Hyperlipemia    Hypertension    Hypothyroidism    Increased BMI    Lumbar spondylosis    Melanoma (HCC)    RIGHT UPPER ARM   Menopause    Osteoarthritis    Osteopenia    Osteoporosis  Shingles    h/o breast   SUI (stress urinary incontinence, female)    Symptomatic varicose veins of both lower extremities    Thyroid  disease    Past Surgical History:  Procedure Laterality Date   CATARACT EXTRACTION     CATARACT EXTRACTION     COLONOSCOPY     COLONOSCOPY WITH PROPOFOL  N/A 09/18/2017   Procedure: COLONOSCOPY WITH PROPOFOL ;  Surgeon: Viktoria Lamar DASEN, MD;  Location: [facility] ENDOSCOPY;  Service: Endoscopy;  Laterality: N/A;   ESOPHAGOGASTRODUODENOSCOPY     ESOPHAGOGASTRODUODENOSCOPY (EGD) WITH PROPOFOL  N/A 04/22/2023   Procedure:  ESOPHAGOGASTRODUODENOSCOPY (EGD) WITH PROPOFOL ;  Surgeon: Onita Elspeth Sharper, DO;  Location: [facility] ENDOSCOPY;  Service: Gastroenterology;  Laterality: N/A;   EYE SURGERY     MALONEY DILATION  04/22/2023   Procedure: MALONEY DILATION;  Surgeon: Onita Elspeth Sharper, DO;  Location: [facility] ENDOSCOPY;  Service: Gastroenterology;;   TUBAL LIGATION     FAMILY HISTORY Family History  Problem Relation Age of Onset   Macular degeneration Mother    Lung cancer Mother    Heart disease Mother    Osteoporosis Mother    Prostate cancer Father    Liver cancer Sister    Breast cancer Neg Hx    Diabetes Neg Hx    Colon cancer Neg Hx    Ovarian cancer Neg Hx    SOCIAL HISTORY Social History   Tobacco Use   Smoking status: Former    Types: Cigarettes    Passive exposure: Past   Smokeless tobacco: Never  Vaping Use   Vaping status: Never Used  Substance Use Topics   Alcohol use: No   Drug use: No       OPHTHALMIC EXAM: Base Eye Exam     Visual Acuity (Snellen - Linear)       Right Left   Dist [place] 20/40 +1 20/30 -2   Dist ph [place] 20/25 -2         Tonometry (Tonopen, 9:52 AM)       Right Left   Pressure 13 13         Pupils       Pupils Dark Light Shape React APD   Right PERRL 3 2 Round Brisk None   Left PERRL 3 2 Round Brisk None         Visual Fields       Left Right    Full Full         Extraocular Movement       Right Left    Full, Ortho Full, Ortho         Neuro/Psych     Oriented x3: Yes   Mood/Affect: Normal         Dilation     Both eyes: 1.0% Mydriacyl, 2.5% Phenylephrine @ 9:53 AM           Slit Lamp and Fundus Exam     Slit Lamp Exam       Right Left   Lids/Lashes Dermatochalasis - upper lid, Meibomian gland dysfunction Dermatochalasis - upper lid, mild MGD   Conjunctiva/Sclera White and quiet White and quiet   Cornea Arcus, well healed cataract wounds, 1+ Punctate epithelial erosions Arcus, well healed cataract wounds,  trace inferior PEE, fine endo pigment   Anterior Chamber Deep and quiet Deep and quiet   Iris Round and dilated Round and dilated   Lens PCIOL in good position with open PC PC IOL in good postion with open PC  Anterior Vitreous Vitreous syneresis, PVD Vitreous syneresis, PVD         Fundus Exam       Right Left   Disc Pink and sharp, Compact, mild PPP Pink and sharp, PPA   C/D Ratio 0.3 0.3   Macula Flat, Blunted foveal reflex, drusen, RPE mottling, clumping, and central atrophy, no heme or edema, no CNV, mild progression of central GA Flat, Blunted foveal reflex, drusen, RPE mottling, clumping, and early atrophy, prominent focal pigment clump temporal mac, No heme or edema   Vessels attenuated, mild tortuosity, copper wiring, AV crossing changes attenuated, Tortuous, AV crossing changes   Periphery Attached, +reticular degeneration; no heme, No RT/RD Attached, mild reticular degeneration, no heme           IMAGING AND PROCEDURES  Imaging and Procedures for 12/25/2023  OCT, Retina - OU - Both Eyes       Right Eye Quality was good. Central Foveal Thickness: 255. Progression has worsened. Findings include normal foveal contour, no IRF, no SRF, retinal drusen , outer retinal atrophy (Central ORA and drusen, mild progression of GA seen best on en face).   Left Eye Quality was good. Central Foveal Thickness: 262. Progression has worsened. Findings include normal foveal contour, no IRF, no SRF, retinal drusen , subretinal hyper-reflective material, pigment epithelial detachment, outer retinal atrophy (Central ORA and drusen, mild progression of GA seen best on en face ).   Notes *Images captured and stored on drive  Diagnosis / Impression:  Non exudative ARMD OU Central ORA and drusen, mild progression of GA seen best on en face  Clinical management:  See below  Abbreviations: NFP - Normal foveal profile. CME - cystoid macular edema. PED - pigment epithelial detachment. IRF -  intraretinal fluid. SRF - subretinal fluid. EZ - ellipsoid zone. ERM - epiretinal membrane. ORA - outer retinal atrophy. ORT - outer retinal tubulation. SRHM - subretinal hyper-reflective material. IRHM - intraretinal hyper-reflective material            ASSESSMENT/PLAN:    ICD-10-CM   1. Advanced atrophic nonexudative age-related macular degeneration of both eyes without subfoveal involvement  H35.3133 OCT, Retina - OU - Both Eyes    2. Essential hypertension  I10     3. Pseudophakia of both eyes  Z96.1     4. Dry eye  H04.129     5. Hypertensive retinopathy of both eyes  H35.033       1. Age related macular degeneration, non-exudative, both eyes - +family hx of ARMD -- pt reports mother had wet AMD with significant vision loss - pt has already been on AREDS 2 supplementation due to family history and has been monitoring her vision w/ Amsler grid provided by Dr. Lelon  - pt notes focal distortion superior paracentral OD             - Recommend amsler grid monitoring - FA 10.18.22 -- no leakage or CNV OU; mild staining of focal vitelliform like lesion OS -- inferior to fovea   - BCVA OD 20/40 - stable, OS 20/25 - stable - OCT shows Central ORA and drusen, mild progression of GA seen best on en face OU - f/u 6 months, DFE/OCT  2,3. Hypertensive retinopathy OU - discussed importance of tight BP control - monitor  4. Pseudophakia OU  - s/p CE/IOL OU [facility])  - IOL in good position, doing well - s/p Yag OD (12.22.23) -- good PC opening  - s/p YAG  OS 06.06.24 -- good PC opening  - monitor  5. Dry Eye Syndrome OU            - recommend artificial tears qid OU   Ophthalmic Meds Ordered this visit:  No orders of the defined types were placed in this encounter.   This document serves as a record of services personally performed by Redell JUDITHANN Hans, MD, PhD. It was created on their behalf by Almetta Pesa, an ophthalmic technician. The creation of this record is  the provider's dictation and/or activities during the visit.    Electronically signed by: Almetta Pesa, OA, 01/04/24  12:05 PM  This document serves as a record of services personally performed by Redell JUDITHANN Hans, MD, PhD. It was created on their behalf by Wanda GEANNIE Keens, COT an ophthalmic technician. The creation of this record is the provider's dictation and/or activities during the visit.    Electronically signed by:  Wanda GEANNIE Keens, COT  01/04/24 12:05 PM  Redell JUDITHANN Hans, M.D., Ph.D. Diseases & Surgery of the Retina and Vitreous [facility]  I have reviewed the above documentation for accuracy and completeness, and I agree with the above. Redell JUDITHANN Hans, M.D., Ph.D. 01/04/24 12:05 PM   Abbreviations: M myopia (nearsighted); A astigmatism; H hyperopia (farsighted); P presbyopia; Mrx spectacle prescription;  CTL contact lenses; OD right eye; OS left eye; OU both eyes  XT exotropia; ET esotropia; PEK punctate epithelial keratitis; PEE punctate epithelial erosions; DES dry eye syndrome; MGD meibomian gland dysfunction; ATs artificial tears; PFAT's preservative free artificial tears; NSC nuclear sclerotic cataract; PSC posterior subcapsular cataract; ERM epi-retinal membrane; PVD posterior vitreous detachment; RD retinal detachment; DM diabetes mellitus; DR diabetic retinopathy; NPDR non-proliferative diabetic retinopathy; PDR proliferative diabetic retinopathy; CSME clinically significant macular edema; DME diabetic macular edema; dbh dot blot hemorrhages; CWS cotton wool spot; POAG primary open angle glaucoma; C/D cup-to-disc ratio; HVF humphrey visual field; GVF goldmann visual field; OCT optical coherence tomography; IOP intraocular pressure; BRVO Branch retinal vein occlusion; CRVO central retinal vein occlusion; CRAO central retinal artery occlusion; BRAO branch retinal artery occlusion; RT retinal tear; SB scleral buckle; PPV pars plana vitrectomy; VH  Vitreous hemorrhage; PRP panretinal laser photocoagulation; IVK intravitreal kenalog ; VMT vitreomacular traction; MH Macular hole;  NVD neovascularization of the disc; NVE neovascularization elsewhere; AREDS age related eye disease study; ARMD age related macular degeneration; POAG primary open angle glaucoma; EBMD epithelial/anterior basement membrane dystrophy; ACIOL anterior chamber intraocular lens; IOL intraocular lens; PCIOL posterior chamber intraocular lens; Phaco/IOL phacoemulsification with intraocular lens placement; PRK photorefractive keratectomy; LASIK laser assisted in situ keratomileusis; HTN hypertension; DM diabetes mellitus; COPD chronic obstructive pulmonary disease

## 2023-12-23 ENCOUNTER — Telehealth (INDEPENDENT_AMBULATORY_CARE_PROVIDER_SITE_OTHER): Payer: Self-pay | Admitting: Vascular Surgery

## 2023-12-23 NOTE — Telephone Encounter (Signed)
 LVM X 3 (2 on cell and 1 on home phone) for pt TCB and schedule FOAM sclero appt with Dr. Marea.   left leg FOAM sclero. see jd. shara #[id number] exp: 9.3.25 - 12.2.25 - 2 units total

## 2023-12-25 ENCOUNTER — Ambulatory Visit (INDEPENDENT_AMBULATORY_CARE_PROVIDER_SITE_OTHER): Admitting: Ophthalmology

## 2023-12-25 ENCOUNTER — Encounter (INDEPENDENT_AMBULATORY_CARE_PROVIDER_SITE_OTHER): Payer: Self-pay | Admitting: Ophthalmology

## 2023-12-25 DIAGNOSIS — I10 Essential (primary) hypertension: Secondary | ICD-10-CM

## 2023-12-25 DIAGNOSIS — Z961 Presence of intraocular lens: Secondary | ICD-10-CM

## 2023-12-25 DIAGNOSIS — H26493 Other secondary cataract, bilateral: Secondary | ICD-10-CM

## 2023-12-25 DIAGNOSIS — H04129 Dry eye syndrome of unspecified lacrimal gland: Secondary | ICD-10-CM

## 2023-12-25 DIAGNOSIS — H353133 Nonexudative age-related macular degeneration, bilateral, advanced atrophic without subfoveal involvement: Secondary | ICD-10-CM

## 2023-12-25 DIAGNOSIS — H35033 Hypertensive retinopathy, bilateral: Secondary | ICD-10-CM

## 2024-01-04 ENCOUNTER — Encounter (INDEPENDENT_AMBULATORY_CARE_PROVIDER_SITE_OTHER): Payer: Self-pay | Admitting: Ophthalmology

## 2024-01-14 ENCOUNTER — Encounter (INDEPENDENT_AMBULATORY_CARE_PROVIDER_SITE_OTHER): Payer: Self-pay | Admitting: Vascular Surgery

## 2024-01-14 ENCOUNTER — Ambulatory Visit (INDEPENDENT_AMBULATORY_CARE_PROVIDER_SITE_OTHER): Admitting: Vascular Surgery

## 2024-01-14 VITALS — BP 152/76 | Resp 18 | Ht 59.0 in | Wt 127.6 lb

## 2024-01-14 DIAGNOSIS — I83893 Varicose veins of bilateral lower extremities with other complications: Secondary | ICD-10-CM | POA: Diagnosis not present

## 2024-01-14 NOTE — Progress Notes (Signed)
  Kathryn Kemp is a 83 y.o.female who presents with painful varicose veins of the left leg  Past Medical History:  Diagnosis Date   Arthritis    Cancer (HCC)    skin   Esophagitis    GERD (gastroesophageal reflux disease)    H/O asbestos exposure    Hemorrhoid    external   Herpes zoster    Hyperlipemia    Hypertension    Hypothyroidism    Increased BMI    Lumbar spondylosis    Melanoma (HCC)    RIGHT UPPER ARM   Menopause    Osteoarthritis    Osteopenia    Osteoporosis    Shingles    h/o breast   SUI (stress urinary incontinence, female)    Symptomatic varicose veins of both lower extremities    Thyroid  disease     Past Surgical History:  Procedure Laterality Date   CATARACT EXTRACTION     CATARACT EXTRACTION     COLONOSCOPY     COLONOSCOPY WITH PROPOFOL  N/A 09/18/2017   Procedure: COLONOSCOPY WITH PROPOFOL ;  Surgeon: Viktoria Lamar DASEN, MD;  Location: [facility] ENDOSCOPY;  Service: Endoscopy;  Laterality: N/A;   ESOPHAGOGASTRODUODENOSCOPY     ESOPHAGOGASTRODUODENOSCOPY (EGD) WITH PROPOFOL  N/A 04/22/2023   Procedure: ESOPHAGOGASTRODUODENOSCOPY (EGD) WITH PROPOFOL ;  Surgeon: Onita Elspeth Sharper, DO;  Location: [facility] ENDOSCOPY;  Service: Gastroenterology;  Laterality: N/A;   EYE SURGERY     MALONEY DILATION  04/22/2023   Procedure: MALONEY DILATION;  Surgeon: Onita Elspeth Sharper, DO;  Location: [facility] ENDOSCOPY;  Service: Gastroenterology;;   TUBAL LIGATION      Current Outpatient Medications  Medication Sig Dispense Refill   Calcium-Vitamin D 600-200 MG-UNIT tablet Take by mouth.     cholecalciferol (VITAMIN D3) 10 MCG/ML LIQD oral liquid Take 400 Units by mouth daily.     etodolac (LODINE) 400 MG tablet Take 400 mg by mouth every morning.     gabapentin (NEURONTIN) 100 MG capsule Take by mouth.     hydrochlorothiazide (HYDRODIURIL) 25 MG tablet Take 25 mg by mouth daily.     magnesium oxide (MAG-OX) 400 (240 Mg) MG tablet Take 400 mg by mouth daily.     meloxicam  (MOBIC) 7.5 MG tablet Take 7.5 mg by mouth daily.     montelukast (SINGULAIR) 10 MG tablet Take 10 mg by mouth at bedtime.     Multiple Vitamins-Minerals (PRESERVISION AREDS 2 PO) Take by mouth.     omeprazole (PRILOSEC) 20 MG capsule Take by mouth.     pravastatin (PRAVACHOL) 40 MG tablet Take 40 mg by mouth daily.     No current facility-administered medications for this visit.    No Known Allergies  Indication: Patient presents with symptomatic varicose veins of the left lower extremity.  Procedure: Foam sclerotherapy was performed on the left lower extremity. Using ultrasound guidance, 5 mL of foam Sotradecol was used to inject the varicosities of the left lower extremity. Compression wraps were placed. The patient tolerated the procedure well.

## 2024-02-14 ENCOUNTER — Encounter (INDEPENDENT_AMBULATORY_CARE_PROVIDER_SITE_OTHER): Payer: Self-pay | Admitting: Vascular Surgery

## 2024-02-14 ENCOUNTER — Ambulatory Visit (INDEPENDENT_AMBULATORY_CARE_PROVIDER_SITE_OTHER): Admitting: Vascular Surgery

## 2024-02-14 VITALS — BP 169/78 | HR 85 | Resp 18 | Ht 59.0 in | Wt 129.2 lb

## 2024-02-14 DIAGNOSIS — I83893 Varicose veins of bilateral lower extremities with other complications: Secondary | ICD-10-CM | POA: Diagnosis not present

## 2024-02-14 NOTE — Progress Notes (Signed)
  Kathryn Kemp is a 83 y.o.female who presents with painful varicose veins of the left leg  Past Medical History:  Diagnosis Date   Arthritis    Cancer (HCC)    skin   Esophagitis    GERD (gastroesophageal reflux disease)    H/O asbestos exposure    Hemorrhoid    external   Herpes zoster    Hyperlipemia    Hypertension    Hypothyroidism    Increased BMI    Lumbar spondylosis    Melanoma (HCC)    RIGHT UPPER ARM   Menopause    Osteoarthritis    Osteopenia    Osteoporosis    Shingles    h/o breast   SUI (stress urinary incontinence, female)    Symptomatic varicose veins of both lower extremities    Thyroid  disease     Past Surgical History:  Procedure Laterality Date   CATARACT EXTRACTION     CATARACT EXTRACTION     COLONOSCOPY     COLONOSCOPY WITH PROPOFOL  N/A 09/18/2017   Procedure: COLONOSCOPY WITH PROPOFOL ;  Surgeon: Viktoria Lamar DASEN, MD;  Location: [facility] ENDOSCOPY;  Service: Endoscopy;  Laterality: N/A;   ESOPHAGOGASTRODUODENOSCOPY     ESOPHAGOGASTRODUODENOSCOPY (EGD) WITH PROPOFOL  N/A 04/22/2023   Procedure: ESOPHAGOGASTRODUODENOSCOPY (EGD) WITH PROPOFOL ;  Surgeon: Onita Elspeth Sharper, DO;  Location: [facility] ENDOSCOPY;  Service: Gastroenterology;  Laterality: N/A;   EYE SURGERY     MALONEY DILATION  04/22/2023   Procedure: MALONEY DILATION;  Surgeon: Onita Elspeth Sharper, DO;  Location: [facility] ENDOSCOPY;  Service: Gastroenterology;;   TUBAL LIGATION      Current Outpatient Medications  Medication Sig Dispense Refill   Calcium-Vitamin D 600-200 MG-UNIT tablet Take by mouth.     cholecalciferol (VITAMIN D3) 10 MCG/ML LIQD oral liquid Take 400 Units by mouth daily.     etodolac (LODINE) 400 MG tablet Take 400 mg by mouth every morning.     gabapentin (NEURONTIN) 100 MG capsule Take by mouth.     hydrochlorothiazide (HYDRODIURIL) 25 MG tablet Take 25 mg by mouth daily.     magnesium oxide (MAG-OX) 400 (240 Mg) MG tablet Take 400 mg by mouth daily.     meloxicam  (MOBIC) 7.5 MG tablet Take 7.5 mg by mouth daily.     montelukast (SINGULAIR) 10 MG tablet Take 10 mg by mouth at bedtime.     Multiple Vitamins-Minerals (PRESERVISION AREDS 2 PO) Take by mouth.     omeprazole (PRILOSEC) 20 MG capsule Take by mouth.     pravastatin (PRAVACHOL) 40 MG tablet Take 40 mg by mouth daily.     No current facility-administered medications for this visit.    No Known Allergies  Indication: Patient presents with symptomatic varicose veins of the left lower extremity.  Procedure: Foam sclerotherapy was performed on the left lower extremity. Using ultrasound guidance, 5 mL of foam Sotradecol was used to inject the varicosities of the left lower extremity. Compression wraps were placed. The patient tolerated the procedure well.

## 2024-02-26 DIAGNOSIS — E538 Deficiency of other specified B group vitamins: Secondary | ICD-10-CM | POA: Diagnosis not present

## 2024-02-26 DIAGNOSIS — E782 Mixed hyperlipidemia: Secondary | ICD-10-CM | POA: Diagnosis not present

## 2024-02-26 DIAGNOSIS — R739 Hyperglycemia, unspecified: Secondary | ICD-10-CM | POA: Diagnosis not present

## 2024-02-26 DIAGNOSIS — D500 Iron deficiency anemia secondary to blood loss (chronic): Secondary | ICD-10-CM | POA: Diagnosis not present

## 2024-03-04 DIAGNOSIS — E1169 Type 2 diabetes mellitus with other specified complication: Secondary | ICD-10-CM | POA: Diagnosis not present

## 2024-03-04 DIAGNOSIS — E782 Mixed hyperlipidemia: Secondary | ICD-10-CM | POA: Diagnosis not present

## 2024-03-04 DIAGNOSIS — Z23 Encounter for immunization: Secondary | ICD-10-CM | POA: Diagnosis not present

## 2024-03-04 DIAGNOSIS — Z79899 Other long term (current) drug therapy: Secondary | ICD-10-CM | POA: Diagnosis not present

## 2024-03-04 DIAGNOSIS — E538 Deficiency of other specified B group vitamins: Secondary | ICD-10-CM | POA: Diagnosis not present

## 2024-03-17 ENCOUNTER — Encounter (INDEPENDENT_AMBULATORY_CARE_PROVIDER_SITE_OTHER): Payer: Self-pay | Admitting: Vascular Surgery

## 2024-03-17 ENCOUNTER — Ambulatory Visit (INDEPENDENT_AMBULATORY_CARE_PROVIDER_SITE_OTHER): Admitting: Vascular Surgery

## 2024-03-17 VITALS — BP 168/62 | HR 92 | Resp 18 | Wt 128.8 lb

## 2024-03-17 DIAGNOSIS — I83893 Varicose veins of bilateral lower extremities with other complications: Secondary | ICD-10-CM

## 2024-03-17 NOTE — Progress Notes (Signed)
  Kathryn Kemp is a 83 y.o.female who presents with painful varicose veins of the left leg  Past Medical History:  Diagnosis Date   Arthritis    Cancer (HCC)    skin   Esophagitis    GERD (gastroesophageal reflux disease)    H/O asbestos exposure    Hemorrhoid    external   Herpes zoster    Hyperlipemia    Hypertension    Hypothyroidism    Increased BMI    Lumbar spondylosis    Melanoma (HCC)    RIGHT UPPER ARM   Menopause    Osteoarthritis    Osteopenia    Osteoporosis    Shingles    h/o breast   SUI (stress urinary incontinence, female)    Symptomatic varicose veins of both lower extremities    Thyroid  disease     Past Surgical History:  Procedure Laterality Date   CATARACT EXTRACTION     CATARACT EXTRACTION     COLONOSCOPY     COLONOSCOPY WITH PROPOFOL  N/A 09/18/2017   Procedure: COLONOSCOPY WITH PROPOFOL ;  Surgeon: Viktoria Lamar DASEN, MD;  Location: [facility] ENDOSCOPY;  Service: Endoscopy;  Laterality: N/A;   ESOPHAGOGASTRODUODENOSCOPY     ESOPHAGOGASTRODUODENOSCOPY (EGD) WITH PROPOFOL  N/A 04/22/2023   Procedure: ESOPHAGOGASTRODUODENOSCOPY (EGD) WITH PROPOFOL ;  Surgeon: Onita Elspeth Sharper, DO;  Location: [facility] ENDOSCOPY;  Service: Gastroenterology;  Laterality: N/A;   EYE SURGERY     MALONEY DILATION  04/22/2023   Procedure: MALONEY DILATION;  Surgeon: Onita Elspeth Sharper, DO;  Location: [facility] ENDOSCOPY;  Service: Gastroenterology;;   TUBAL LIGATION      Current Outpatient Medications  Medication Sig Dispense Refill   Calcium-Vitamin D 600-200 MG-UNIT tablet Take by mouth.     cholecalciferol (VITAMIN D3) 10 MCG/ML LIQD oral liquid Take 400 Units by mouth daily.     etodolac (LODINE) 400 MG tablet Take 400 mg by mouth every morning.     gabapentin (NEURONTIN) 100 MG capsule Take by mouth.     hydrochlorothiazide (HYDRODIURIL) 25 MG tablet Take 25 mg by mouth daily.     magnesium oxide (MAG-OX) 400 (240 Mg) MG tablet Take 400 mg by mouth daily.     meloxicam  (MOBIC) 7.5 MG tablet Take 7.5 mg by mouth daily.     montelukast (SINGULAIR) 10 MG tablet Take 10 mg by mouth at bedtime.     Multiple Vitamins-Minerals (PRESERVISION AREDS 2 PO) Take by mouth.     omeprazole (PRILOSEC) 20 MG capsule Take by mouth.     pravastatin (PRAVACHOL) 40 MG tablet Take 40 mg by mouth daily.     No current facility-administered medications for this visit.    No Known Allergies  Indication: Patient presents with symptomatic varicose veins of the left lower extremity.  Procedure: Foam sclerotherapy was performed on the left lower extremity. Using ultrasound guidance, 5 mL of foam Sotradecol was used to inject the varicosities of the left lower extremity. Compression wraps were placed. The patient tolerated the procedure well.

## 2024-04-21 ENCOUNTER — Ambulatory Visit (INDEPENDENT_AMBULATORY_CARE_PROVIDER_SITE_OTHER): Admitting: Vascular Surgery

## 2024-04-21 VITALS — BP 166/66 | HR 73 | Wt 130.4 lb

## 2024-04-21 DIAGNOSIS — I83893 Varicose veins of bilateral lower extremities with other complications: Secondary | ICD-10-CM

## 2024-04-21 NOTE — Progress Notes (Signed)
" °  Kathryn Kemp is a 84 y.o.female who presents with painful varicose veins of the left leg  Past Medical History:  Diagnosis Date   Arthritis    Cancer (HCC)    skin   Esophagitis    GERD (gastroesophageal reflux disease)    H/O asbestos exposure    Hemorrhoid    external   Herpes zoster    Hyperlipemia    Hypertension    Hypothyroidism    Increased BMI    Lumbar spondylosis    Melanoma (HCC)    RIGHT UPPER ARM   Menopause    Osteoarthritis    Osteopenia    Osteoporosis    Shingles    h/o breast   SUI (stress urinary incontinence, female)    Symptomatic varicose veins of both lower extremities    Thyroid  disease     Past Surgical History:  Procedure Laterality Date   CATARACT EXTRACTION     CATARACT EXTRACTION     COLONOSCOPY     COLONOSCOPY WITH PROPOFOL  N/A 09/18/2017   Procedure: COLONOSCOPY WITH PROPOFOL ;  Surgeon: Viktoria Lamar DASEN, MD;  Location: [facility] ENDOSCOPY;  Service: Endoscopy;  Laterality: N/A;   ESOPHAGOGASTRODUODENOSCOPY     ESOPHAGOGASTRODUODENOSCOPY (EGD) WITH PROPOFOL  N/A 04/22/2023   Procedure: ESOPHAGOGASTRODUODENOSCOPY (EGD) WITH PROPOFOL ;  Surgeon: Onita Elspeth Sharper, DO;  Location: [facility] ENDOSCOPY;  Service: Gastroenterology;  Laterality: N/A;   EYE SURGERY     MALONEY DILATION  04/22/2023   Procedure: MALONEY DILATION;  Surgeon: Onita Elspeth Sharper, DO;  Location: [facility] ENDOSCOPY;  Service: Gastroenterology;;   TUBAL LIGATION      Current Outpatient Medications  Medication Sig Dispense Refill   Calcium-Vitamin D 600-200 MG-UNIT tablet Take by mouth.     cholecalciferol (VITAMIN D3) 10 MCG/ML LIQD oral liquid Take 400 Units by mouth daily.     etodolac (LODINE) 400 MG tablet Take 400 mg by mouth every morning.     gabapentin (NEURONTIN) 100 MG capsule Take by mouth.     hydrochlorothiazide (HYDRODIURIL) 25 MG tablet Take 25 mg by mouth daily.     magnesium oxide (MAG-OX) 400 (240 Mg) MG tablet Take 400 mg by mouth daily.     meloxicam  (MOBIC) 7.5 MG tablet Take 7.5 mg by mouth daily.     montelukast (SINGULAIR) 10 MG tablet Take 10 mg by mouth at bedtime.     Multiple Vitamins-Minerals (PRESERVISION AREDS 2 PO) Take by mouth.     omeprazole (PRILOSEC) 20 MG capsule Take by mouth.     pravastatin (PRAVACHOL) 40 MG tablet Take 40 mg by mouth daily.     No current facility-administered medications for this visit.    Allergies[1]  Indication: Patient presents with symptomatic varicose veins of the left lower extremity.  Procedure: Foam sclerotherapy was performed on the left lower extremity. Using ultrasound guidance, 5 mL of foam Sotradecol was used to inject the varicosities of the left lower extremity. Compression wraps were placed. The patient tolerated the procedure well.   Patient still has significant varicosities in the left leg and would benefit from further foam sclerotherapy    [1] No Known Allergies  "

## 2024-05-01 ENCOUNTER — Other Ambulatory Visit: Payer: Self-pay | Admitting: Internal Medicine

## 2024-05-01 DIAGNOSIS — Z1231 Encounter for screening mammogram for malignant neoplasm of breast: Secondary | ICD-10-CM

## 2024-06-03 ENCOUNTER — Encounter

## 2024-06-23 ENCOUNTER — Encounter (INDEPENDENT_AMBULATORY_CARE_PROVIDER_SITE_OTHER): Admitting: Ophthalmology

## 2024-06-24 ENCOUNTER — Encounter (INDEPENDENT_AMBULATORY_CARE_PROVIDER_SITE_OTHER): Admitting: Ophthalmology
